# Patient Record
Sex: Female | Born: 1985 | Race: Black or African American | Hispanic: No | Marital: Married | Smoking: Former smoker
Health system: Southern US, Community
[De-identification: ages and names within clinical notes are randomized; demographics above are authoritative.]

## PROBLEM LIST (undated history)

## (undated) DIAGNOSIS — C801 Malignant (primary) neoplasm, unspecified: Secondary | ICD-10-CM

## (undated) DIAGNOSIS — I10 Essential (primary) hypertension: Secondary | ICD-10-CM

## (undated) HISTORY — PX: BREAST SURGERY: SHX581

## (undated) HISTORY — PX: NO PAST SURGERIES: SHX2092

## (undated) HISTORY — PX: OTHER SURGICAL HISTORY: SHX169

---
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
## 2010-09-24 ENCOUNTER — Inpatient Hospital Stay (HOSPITAL_COMMUNITY)
Admission: AD | Admit: 2010-09-24 | Discharge: 2010-09-26 | Payer: Self-pay | Source: Home / Self Care | Admitting: Obstetrics and Gynecology

## 2010-09-25 ENCOUNTER — Ambulatory Visit (HOSPITAL_COMMUNITY)
Admission: RE | Admit: 2010-09-25 | Discharge: 2010-09-25 | Payer: Self-pay | Source: Home / Self Care | Admitting: Obstetrics and Gynecology

## 2010-09-29 ENCOUNTER — Inpatient Hospital Stay (HOSPITAL_COMMUNITY)
Admission: AD | Admit: 2010-09-29 | Discharge: 2010-09-29 | Payer: Self-pay | Source: Home / Self Care | Admitting: Obstetrics and Gynecology

## 2010-10-04 ENCOUNTER — Inpatient Hospital Stay (HOSPITAL_COMMUNITY)
Admission: AD | Admit: 2010-10-04 | Discharge: 2010-10-12 | Payer: Self-pay | Source: Home / Self Care | Attending: Obstetrics and Gynecology | Admitting: Obstetrics and Gynecology

## 2010-10-08 ENCOUNTER — Encounter (INDEPENDENT_AMBULATORY_CARE_PROVIDER_SITE_OTHER): Payer: Self-pay | Admitting: Obstetrics and Gynecology

## 2010-10-08 DIAGNOSIS — O1490 Unspecified pre-eclampsia, unspecified trimester: Secondary | ICD-10-CM

## 2010-10-16 ENCOUNTER — Inpatient Hospital Stay (HOSPITAL_COMMUNITY)
Admission: AD | Admit: 2010-10-16 | Discharge: 2010-10-16 | Disposition: A | Discharge status: Still In Hospital | Payer: Self-pay | Source: Home / Self Care | Attending: Obstetrics and Gynecology | Admitting: Obstetrics and Gynecology

## 2011-01-15 LAB — COMPREHENSIVE METABOLIC PANEL
ALT: 23 U/L (ref 0–35)
ALT: 24 U/L (ref 0–35)
ALT: 35 U/L (ref 0–35)
ALT: 46 U/L — ABNORMAL HIGH (ref 0–35)
ALT: 9 U/L (ref 0–35)
AST: 21 U/L (ref 0–37)
AST: 24 U/L (ref 0–37)
AST: 30 U/L (ref 0–37)
AST: 15 U/L (ref 0–37)
AST: 18 U/L (ref 0–37)
Albumin: 2.1 g/dL — ABNORMAL LOW (ref 3.5–5.2)
Albumin: 2.5 g/dL — ABNORMAL LOW (ref 3.5–5.2)
Albumin: 2.5 g/dL — ABNORMAL LOW (ref 3.5–5.2)
Albumin: 2.3 g/dL — ABNORMAL LOW (ref 3.5–5.2)
Albumin: 2.6 g/dL — ABNORMAL LOW (ref 3.5–5.2)
Albumin: 2.8 g/dL — ABNORMAL LOW (ref 3.5–5.2)
Alkaline Phosphatase: 115 U/L (ref 39–117)
Alkaline Phosphatase: 187 U/L — ABNORMAL HIGH (ref 39–117)
Alkaline Phosphatase: 201 U/L — ABNORMAL HIGH (ref 39–117)
Alkaline Phosphatase: 169 U/L — ABNORMAL HIGH (ref 39–117)
Alkaline Phosphatase: 181 U/L — ABNORMAL HIGH (ref 39–117)
BUN: 10 mg/dL (ref 6–23)
BUN: 12 mg/dL (ref 6–23)
BUN: 12 mg/dL (ref 6–23)
BUN: 8 mg/dL (ref 6–23)
BUN: 8 mg/dL (ref 6–23)
BUN: 8 mg/dL (ref 6–23)
CO2: 21 mEq/L (ref 19–32)
CO2: 21 mEq/L (ref 19–32)
CO2: 24 mEq/L (ref 19–32)
Calcium: 7.2 mg/dL — ABNORMAL LOW (ref 8.4–10.5)
Calcium: 7.3 mg/dL — ABNORMAL LOW (ref 8.4–10.5)
Calcium: 7.6 mg/dL — ABNORMAL LOW (ref 8.4–10.5)
Calcium: 9 mg/dL (ref 8.4–10.5)
Calcium: 8.7 mg/dL (ref 8.4–10.5)
Calcium: 9 mg/dL (ref 8.4–10.5)
Calcium: 9.3 mg/dL (ref 8.4–10.5)
Chloride: 106 mEq/L (ref 96–112)
Chloride: 106 mEq/L (ref 96–112)
Chloride: 107 mEq/L (ref 96–112)
Chloride: 109 mEq/L (ref 96–112)
Chloride: 109 mEq/L (ref 96–112)
Creatinine, Ser: 0.88 mg/dL (ref 0.4–1.2)
Creatinine, Ser: 0.88 mg/dL (ref 0.4–1.2)
Creatinine, Ser: 0.75 mg/dL (ref 0.4–1.2)
Creatinine, Ser: 0.82 mg/dL (ref 0.4–1.2)
GFR calc Af Amer: >60 mL/min (ref >60)
GFR calc Af Amer: >60 mL/min (ref >60)
GFR calc Af Amer: >60 mL/min (ref >60)
GFR calc Af Amer: >60 mL/min (ref >60)
GFR calc Af Amer: >60 mL/min (ref >60)
GFR calc non Af Amer: >60 mL/min (ref >60)
GFR calc non Af Amer: >60 mL/min (ref >60)
GFR calc non Af Amer: >60 mL/min (ref >60)
GFR calc non Af Amer: >60 mL/min (ref >60)
Glucose, Bld: 110 mg/dL — ABNORMAL HIGH (ref 70–99)
Glucose, Bld: 93 mg/dL (ref 70–99)
Glucose, Bld: 96 mg/dL (ref 70–99)
Glucose, Bld: 95 mg/dL (ref 70–99)
Potassium: 4.1 mEq/L (ref 3.5–5.1)
Potassium: 4.7 mEq/L (ref 3.5–5.1)
Potassium: 4.9 mEq/L (ref 3.5–5.1)
Potassium: 4.1 mEq/L (ref 3.5–5.1)
Potassium: 4.2 mEq/L (ref 3.5–5.1)
Sodium: 132 mEq/L — ABNORMAL LOW (ref 135–145)
Sodium: 136 mEq/L (ref 135–145)
Sodium: 139 mEq/L (ref 135–145)
Sodium: 136 mEq/L (ref 135–145)
Total Bilirubin: 0.3 mg/dL (ref 0.3–1.2)
Total Bilirubin: 0.4 mg/dL (ref 0.3–1.2)
Total Bilirubin: 0.6 mg/dL (ref 0.3–1.2)
Total Bilirubin: 1 mg/dL (ref 0.3–1.2)
Total Bilirubin: 0.1 mg/dL — ABNORMAL LOW (ref 0.3–1.2)
Total Bilirubin: 0.3 mg/dL (ref 0.3–1.2)
Total Bilirubin: 0.4 mg/dL (ref 0.3–1.2)
Total Protein: 5.1 g/dL — ABNORMAL LOW (ref 6.0–8.3)
Total Protein: 5.5 g/dL — ABNORMAL LOW (ref 6.0–8.3)
Total Protein: 5.8 g/dL — ABNORMAL LOW (ref 6.0–8.3)
Total Protein: 5 g/dL — ABNORMAL LOW (ref 6.0–8.3)
Total Protein: 5.2 g/dL — ABNORMAL LOW (ref 6.0–8.3)
Total Protein: 6.1 g/dL (ref 6.0–8.3)

## 2011-01-15 LAB — CBC
HCT: 29.6 % — ABNORMAL LOW (ref 36.0–46.0)
HCT: 31 % — ABNORMAL LOW (ref 36.0–46.0)
HCT: 31.4 % — ABNORMAL LOW (ref 36.0–46.0)
HCT: 33.2 % — ABNORMAL LOW (ref 36.0–46.0)
HCT: 34.8 % — ABNORMAL LOW (ref 36.0–46.0)
Hemoglobin: 10.4 g/dL — ABNORMAL LOW (ref 12.0–15.0)
Hemoglobin: 10.8 g/dL — ABNORMAL LOW (ref 12.0–15.0)
Hemoglobin: 10.9 g/dL — ABNORMAL LOW (ref 12.0–15.0)
Hemoglobin: 11.5 g/dL — ABNORMAL LOW (ref 12.0–15.0)
Hemoglobin: 9.1 g/dL — ABNORMAL LOW (ref 12.0–15.0)
MCH: 29.4 pg (ref 26.0–34.0)
MCH: 29.5 pg (ref 26.0–34.0)
MCH: 29.5 pg (ref 26.0–34.0)
MCH: 28.6 pg (ref 26.0–34.0)
MCH: 28.8 pg (ref 26.0–34.0)
MCHC: 32.7 g/dL (ref 30.0–36.0)
MCHC: 33 g/dL (ref 30.0–36.0)
MCHC: 33.1 g/dL (ref 30.0–36.0)
MCHC: 33.1 g/dL (ref 30.0–36.0)
MCHC: 33.2 g/dL (ref 30.0–36.0)
MCHC: 32.6 g/dL (ref 30.0–36.0)
MCHC: 32.8 g/dL (ref 30.0–36.0)
MCV: 88.7 fL (ref 78.0–100.0)
MCV: 89.3 fL (ref 78.0–100.0)
MCV: 89.3 fL (ref 78.0–100.0)
MCV: 89.8 fL (ref 78.0–100.0)
MCV: 87.3 fL (ref 78.0–100.0)
Platelets: 115 10*3/uL — ABNORMAL LOW (ref 150–400)
Platelets: 154 10*3/uL (ref 150–400)
Platelets: 88 10*3/uL — ABNORMAL LOW (ref 150–400)
Platelets: 96 10*3/uL — ABNORMAL LOW (ref 150–400)
Platelets: 150 10*3/uL (ref 150–400)
Platelets: 159 10*3/uL (ref 150–400)
Platelets: 163 10*3/uL (ref 150–400)
Platelets: 169 10*3/uL (ref 150–400)
RBC: 3.19 MIL/uL — ABNORMAL LOW (ref 3.87–5.11)
RBC: 3.53 MIL/uL — ABNORMAL LOW (ref 3.87–5.11)
RBC: 3.54 MIL/uL — ABNORMAL LOW (ref 3.87–5.11)
RBC: 3.93 MIL/uL (ref 3.87–5.11)
RBC: 3.56 MIL/uL — ABNORMAL LOW (ref 3.87–5.11)
RBC: 3.92 MIL/uL (ref 3.87–5.11)
RDW: 17.6 % — ABNORMAL HIGH (ref 11.5–15.5)
RDW: 17.6 % — ABNORMAL HIGH (ref 11.5–15.5)
RDW: 17.9 % — ABNORMAL HIGH (ref 11.5–15.5)
RDW: 18 % — ABNORMAL HIGH (ref 11.5–15.5)
RDW: 18.2 % — ABNORMAL HIGH (ref 11.5–15.5)
RDW: 18.2 % — ABNORMAL HIGH (ref 11.5–15.5)
RDW: 15.8 % — ABNORMAL HIGH (ref 11.5–15.5)
RDW: 15.9 % — ABNORMAL HIGH (ref 11.5–15.5)
RDW: 16.2 % — ABNORMAL HIGH (ref 11.5–15.5)
RDW: 16.5 % — ABNORMAL HIGH (ref 11.5–15.5)
WBC: 10.1 10*3/uL (ref 4.0–10.5)
WBC: 13.5 10*3/uL — ABNORMAL HIGH (ref 4.0–10.5)
WBC: 13.6 10*3/uL — ABNORMAL HIGH (ref 4.0–10.5)
WBC: 9.7 10*3/uL (ref 4.0–10.5)

## 2011-01-15 LAB — URINALYSIS, ROUTINE W REFLEX MICROSCOPIC
Bilirubin Urine: NEGATIVE
Bilirubin Urine: NEGATIVE
Bilirubin Urine: NEGATIVE
Glucose, UA: NEGATIVE mg/dL
Hgb urine dipstick: NEGATIVE
Ketones, ur: NEGATIVE mg/dL
Leukocytes, UA: NEGATIVE
Nitrite: NEGATIVE
Nitrite: NEGATIVE
Nitrite: NEGATIVE
Protein, ur: 30 mg/dL — AB
Specific Gravity, Urine: >1.03 — ABNORMAL HIGH (ref 1.005–1.030)
Specific Gravity, Urine: 1.02 (ref 1.005–1.030)
Specific Gravity, Urine: >1.03 — ABNORMAL HIGH (ref 1.005–1.030)
pH: 6 (ref 5.0–8.0)
pH: 6.5 (ref 5.0–8.0)
pH: 7 (ref 5.0–8.0)

## 2011-01-15 LAB — HEPATIC FUNCTION PANEL
ALT: 39 U/L — ABNORMAL HIGH (ref 0–35)
AST: 36 U/L (ref 0–37)
Bilirubin, Direct: 0.2 mg/dL (ref 0.0–0.3)
Indirect Bilirubin: 0.5 mg/dL (ref 0.3–0.9)
Total Protein: 5 g/dL — ABNORMAL LOW (ref 6.0–8.3)

## 2011-01-15 LAB — LACTATE DEHYDROGENASE
LDH: 214 U/L (ref 94–250)
LDH: 230 U/L (ref 94–250)
LDH: 292 U/L — ABNORMAL HIGH (ref 94–250)
LDH: 123 U/L (ref 94–250)

## 2011-01-15 LAB — CREATININE CLEARANCE, URINE, 24 HOUR
Creatinine Clearance: 208 mL/min — ABNORMAL HIGH (ref 75–115)
Creatinine, 24H Ur: 1973 mg/d — ABNORMAL HIGH (ref 700–1800)
Creatinine, Urine: 175.4 mg/dL
Creatinine: 0.66 mg/dL (ref 0.40–1.20)

## 2011-01-15 LAB — URIC ACID
Uric Acid, Serum: 6.6 mg/dL (ref 2.4–7.0)
Uric Acid, Serum: 6.6 mg/dL (ref 2.4–7.0)
Uric Acid, Serum: 6.8 mg/dL (ref 2.4–7.0)
Uric Acid, Serum: 6.8 mg/dL (ref 2.4–7.0)
Uric Acid, Serum: 5.4 mg/dL (ref 2.4–7.0)
Uric Acid, Serum: 5.5 mg/dL (ref 2.4–7.0)
Uric Acid, Serum: 5.5 mg/dL (ref 2.4–7.0)
Uric Acid, Serum: 5.6 mg/dL (ref 2.4–7.0)
Uric Acid, Serum: 5.6 mg/dL (ref 2.4–7.0)

## 2011-01-15 LAB — DIFFERENTIAL
Basophils Absolute: 0 10*3/uL (ref 0.0–0.1)
Basophils Relative: 0 % (ref 0–1)
Eosinophils Absolute: 0.1 10*3/uL (ref 0.0–0.7)
Eosinophils Absolute: 0.1 10*3/uL (ref 0.0–0.7)
Eosinophils Relative: 1 % (ref 0–5)
Eosinophils Relative: 1 % (ref 0–5)
Lymphocytes Relative: 23 % (ref 12–46)
Lymphs Abs: 1.9 10*3/uL (ref 0.7–4.0)
Monocytes Absolute: 0.6 10*3/uL (ref 0.1–1.0)
Monocytes Relative: 8 % (ref 3–12)
Neutro Abs: 6.9 10*3/uL (ref 1.7–7.7)

## 2011-01-15 LAB — MRSA PCR SCREENING: MRSA by PCR: NEGATIVE

## 2011-01-15 LAB — URINE CULTURE: Colony Count: >=100000

## 2011-01-15 LAB — URINE MICROSCOPIC-ADD ON

## 2011-01-15 LAB — PROTEIN, URINE, 24 HOUR: Protein, Urine: 32 mg/dL

## 2011-01-15 LAB — BASIC METABOLIC PANEL
BUN: 12 mg/dL (ref 6–23)
Calcium: 6.9 mg/dL — ABNORMAL LOW (ref 8.4–10.5)
Creatinine, Ser: 0.9 mg/dL (ref 0.4–1.2)
GFR calc Af Amer: >60 mL/min (ref >60)

## 2011-01-15 LAB — STREP B DNA PROBE

## 2011-09-01 ENCOUNTER — Emergency Department (HOSPITAL_COMMUNITY)
Admission: EM | Admit: 2011-09-01 | Discharge: 2011-09-01 | Disposition: A | Discharge status: Home / Self Care | Payer: BC Managed Care – PPO | Attending: Emergency Medicine | Admitting: Emergency Medicine

## 2011-09-01 DIAGNOSIS — I10 Essential (primary) hypertension: Secondary | ICD-10-CM | POA: Insufficient documentation

## 2011-09-01 DIAGNOSIS — K089 Disorder of teeth and supporting structures, unspecified: Secondary | ICD-10-CM | POA: Insufficient documentation

## 2011-09-01 DIAGNOSIS — K029 Dental caries, unspecified: Secondary | ICD-10-CM | POA: Insufficient documentation

## 2011-11-09 IMAGING — US US OB COMP +14 WK
1 series · 15 of 28 positions shown · non-contrast
Comparison: none

[Series 1: us ob comp +14 wk · 15 of 35 slices shown]
[im 1/35]
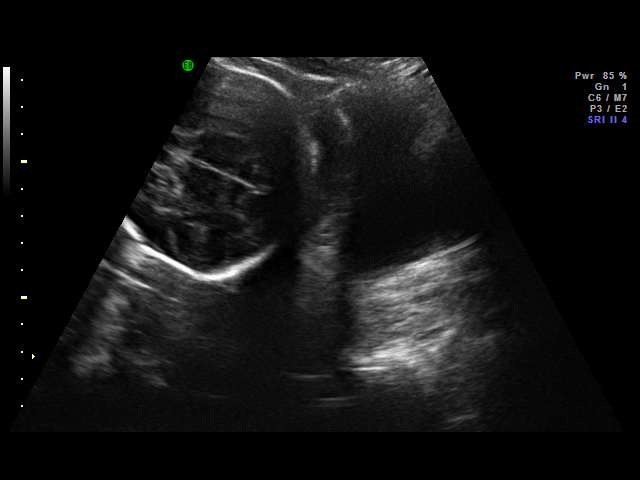
[im 3/35]
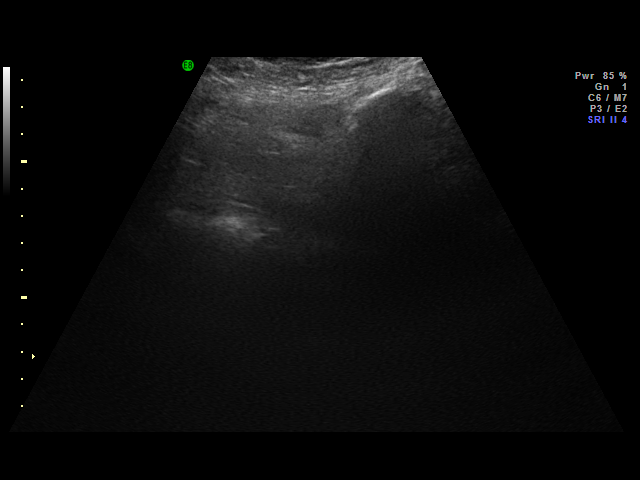
[im 6/35]
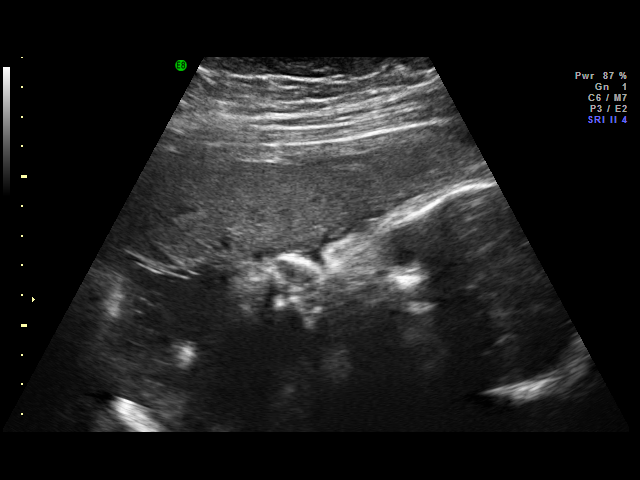
[im 8/35]
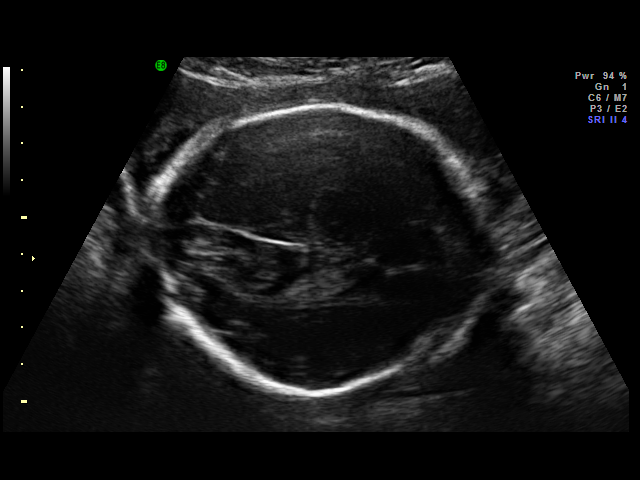
[im 11/35]
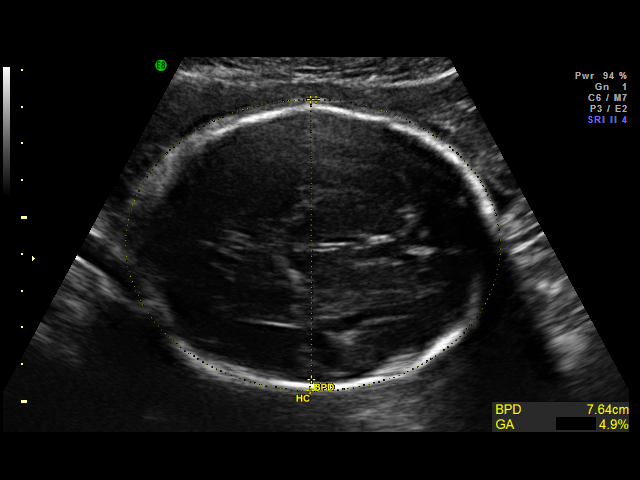
[im 13/35]
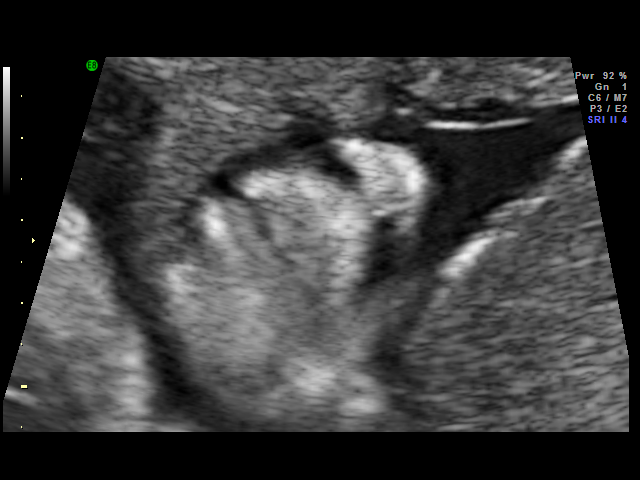
[im 16/35]
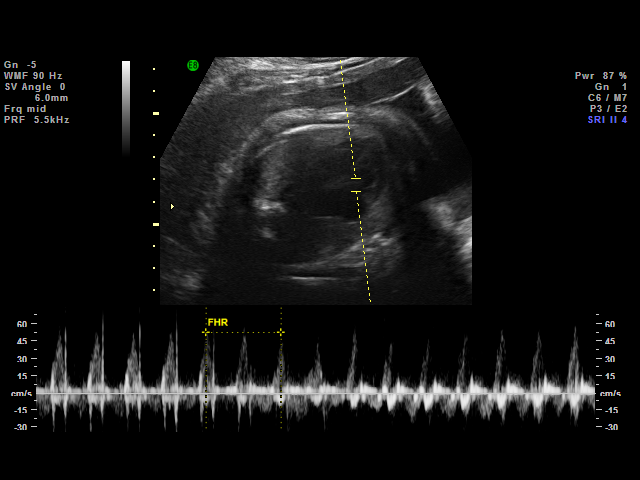
[im 18/35]
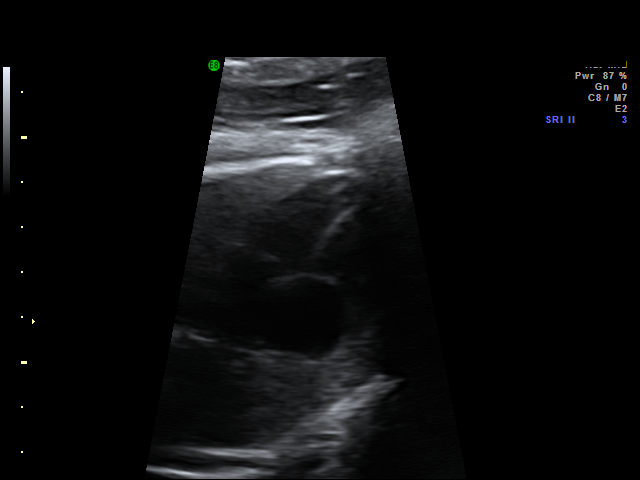
[im 19/35]
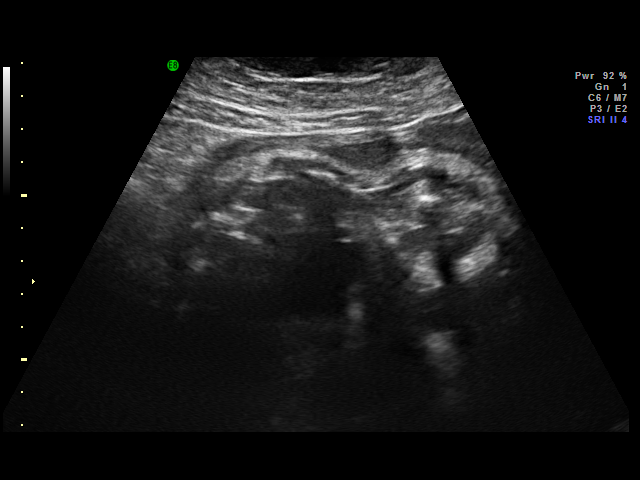
[im 22/35]
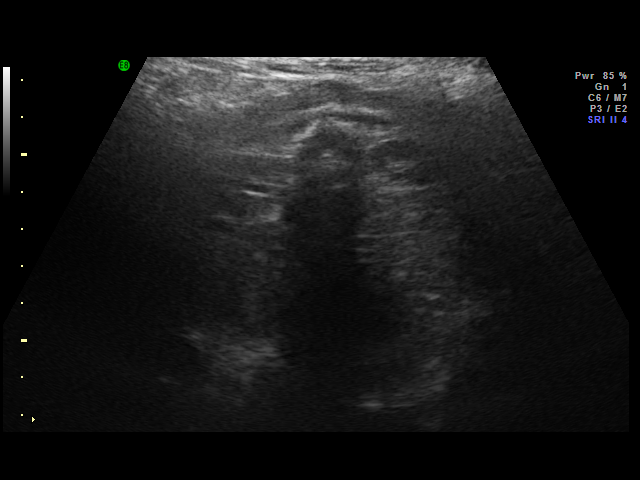
[im 24/35]
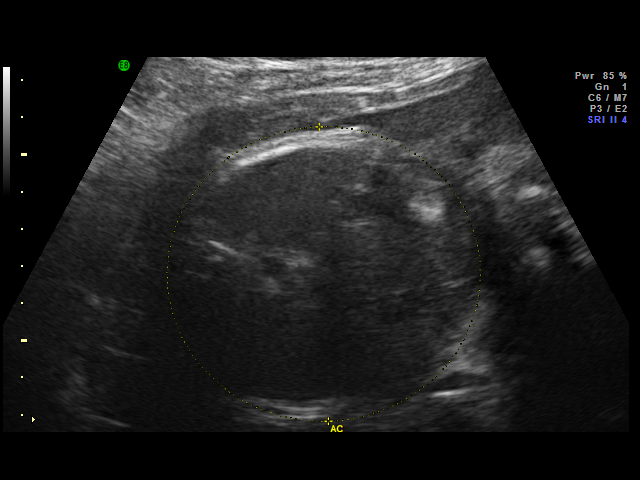
[im 27/35]
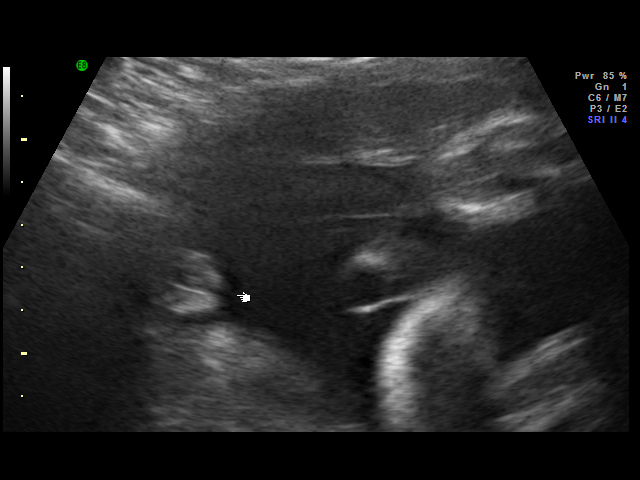
[im 29/35]
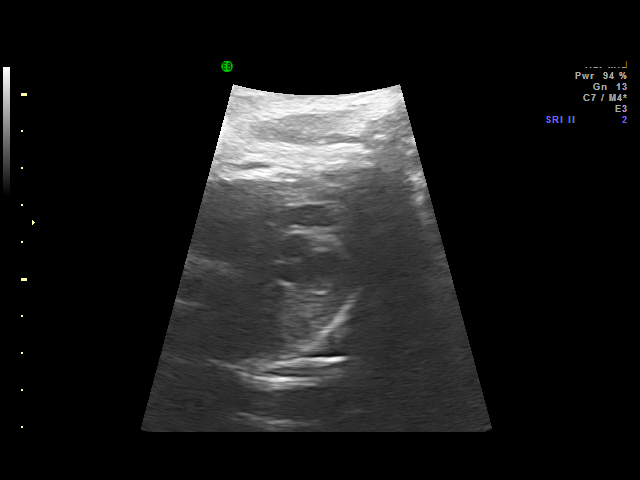
[im 32/35]
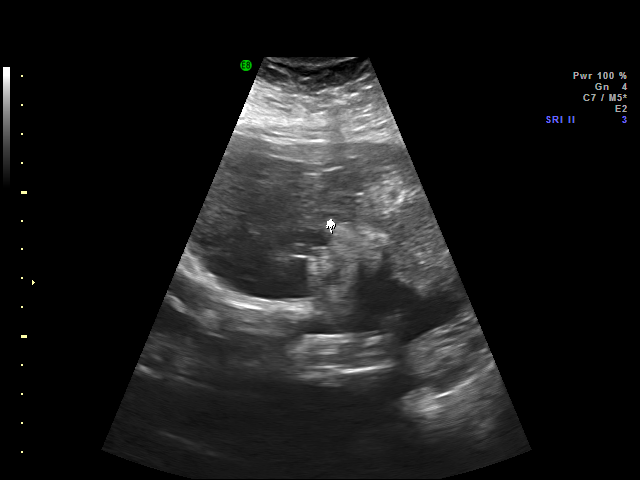
[im 35/35]
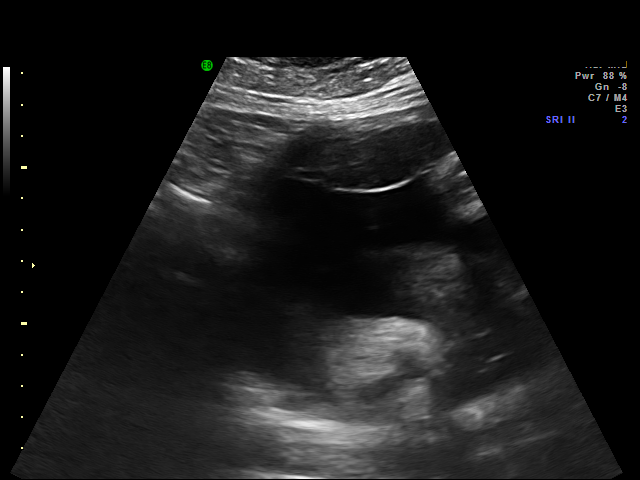

[15 of 28 positions shown; findings below may reference images not displayed]

OBSTETRICS REPORT
                      (Signed Final 09/25/2010 [DATE])

 Name:     CHECHE DIB                 Visit Date: 09/25/2010 [DATE]

 Order#:         _O
Procedures

 US OB COMP +14 WK                                     76805.1
Indications

 Hypertension - gestational
Fetal Evaluation

 Fetal Heart Rate:  157                          bpm
 Cardiac Activity:  Observed
 Presentation:      Cephalic
 Placenta:          Anterior, above cervical os
 P. Cord            Not well visualized
 Insertion:

 Amniotic Fluid
 AFI FV:      Subjectively within normal limits
 AFI Sum:     11.19   cm       26  %Tile     Larg Pckt:    4.09  cm
 RUQ:   4.09    cm   RLQ:    2.19   cm    LUQ:   2.3     cm   LLQ:    2.61   cm
Biometry

 BPD:     76.4  mm     G. Age:  30w 5d                CI:         75.0   70 - 86
 OFD:    101.8  mm                                    FL/HC:      21.4   19.1 -

 HC:     285.7  mm     G. Age:  31w 3d        3  %    HC/AC:      1.02   0.96 -

 AC:     281.4  mm     G. Age:  32w 1d       42  %    FL/BPD:     80.1   71 - 87
 FL:      61.2  mm     G. Age:  31w 5d       22  %    FL/AC:      21.7   20 - 24
 HUM:     56.2  mm     G. Age:  32w 5d       59  %
 CER:     41.3  mm     G. Age:  35w 2d       84  %
 Est. FW:    4381  gm      4 lb 1 oz     46  %
Gestational Age

 Clinical EDD:  32w 3d                                        EDD:   11/17/10
 U/S Today:     31w 3d                                        EDD:   11/24/10
 Best:          32w 3d     Det. By:  Clinical EDD             EDD:   11/17/10
Anatomy
 Cranium:           Appears normal      Aortic Arch:       Not well
                                                           visualized
 Fetal Cavum:       Appears normal      Ductal Arch:       Not well
                                                           visualized
 Ventricles:        Appears normal      Diaphragm:         Appears normal
 Choroid Plexus:    Appears normal      Stomach:           Appears
                                                           normal, left
                                                           sided
 Cerebellum:        Appears normal      Abdomen:           Appears normal
 Posterior Fossa:   Appears normal      Abdominal Wall:    Not well
                                                           visualized
 Nuchal Fold:       Not applicable      Cord Vessels:      Appears normal
                    (>20 wks GA)                           (3 vessel cord)
 Face:              Appears normal      Kidneys:           Appear normal
                    (lips/profile/orbit
                    s)
 Heart:             Appears normal      Bladder:           Appears normal
                    (4 chamber &
                    axis)
 RVOT:              Appears normal      Spine:             Not well
                                                           visualized
 LVOT:              Not well            Limbs:             Not well
                    visualized                             visualized

 Other:     Fetus appears to be a female. Technically difficult due to
            advanced GA and fetal position.
Targeted Anatomy

 Fetal Central Nervous System
 Cisterna Magna:
Cervix Uterus Adnexa

 Cervical Length:    4.2      cm

 Cervix:       Normal appearance by transabdominal scan.
 Uterus:       No abnormality visualized.
 Left Ovary:    Not visualized.
 Right Ovary:   Not visualized.
 Adnexa:     No adnexal mass visualized.
Impression

 IUP at 32+3 weeks
 Normal level II fetal anatomy; not optimally visualized: LVOT,
 arches, CI, spine and extremities
 Normal amniotic fluid volume
 Measurements consistant with stated EDC with EFW at the
 46th %tile
Recommendations

 Follow-up as clinically indicated.

 questions or concerns.

## 2013-02-09 ENCOUNTER — Ambulatory Visit (INDEPENDENT_AMBULATORY_CARE_PROVIDER_SITE_OTHER): Payer: BC Managed Care – PPO | Admitting: Family Medicine

## 2013-02-09 VITALS — BP 138/90 | HR 100 | Temp 99.9°F | Resp 18 | Ht 72.0 in | Wt 250.2 lb

## 2013-02-09 DIAGNOSIS — K529 Noninfective gastroenteritis and colitis, unspecified: Secondary | ICD-10-CM

## 2013-02-09 DIAGNOSIS — K5289 Other specified noninfective gastroenteritis and colitis: Secondary | ICD-10-CM

## 2013-02-09 DIAGNOSIS — E860 Dehydration: Secondary | ICD-10-CM

## 2013-02-09 DIAGNOSIS — A5901 Trichomonal vulvovaginitis: Secondary | ICD-10-CM

## 2013-02-09 LAB — POCT CBC
Lymph, poc: 1.6 (ref 0.6–3.4)
MCH, POC: 28.1 pg (ref 27–31.2)
MCHC: 31.5 g/dL — AB (ref 31.8–35.4)
MCV: 89.2 fL (ref 80–97)
MID (cbc): 0.4 (ref 0–0.9)
MPV: 11.1 fL (ref 0–99.8)
POC LYMPH PERCENT: 41.9 %L (ref 10–50)
POC MID %: 10.2 %M (ref 0–12)
Platelet Count, POC: 160 10*3/uL (ref 142–424)
RDW, POC: 15.5 %
WBC: 3.9 10*3/uL — AB (ref 4.6–10.2)

## 2013-02-09 LAB — POCT URINALYSIS DIPSTICK
Blood, UA: NEGATIVE
Ketones, UA: NEGATIVE
Protein, UA: NEGATIVE
Spec Grav, UA: 1.015
Urobilinogen, UA: 1

## 2013-02-09 LAB — POCT UA - MICROSCOPIC ONLY: Trichomonas, UA: POSITIVE

## 2013-02-09 MED ORDER — METRONIDAZOLE 500 MG PO TABS: ORAL_TABLET | ORAL | Status: DC

## 2013-02-09 MED ORDER — PROMETHAZINE HCL 25 MG PO TABS: ORAL_TABLET | ORAL | Status: DC

## 2013-02-09 NOTE — Progress Notes (Signed)
[address]   [place], [place]  [address]   [phone]  Subjective:    Patient ID: Karina Rivera, female    DOB: [birth year], 27 y.o.   MRN: [id number]  HPI This 27 y.o. female presents for evaluation of n/v/d.  Onset yesterday.  +fever Tmax 102.  +chills/sweats.  +HA  +vomiting x 1 today; yesterday vomited x 4; food contents and watery; non-bloody. Diarrhea x 6-7; black stools; dark green; watery.  No bloody stools.  +abdominal pain less today but more yesterday.  Mild dizzy.  Has drunk ginger ale 8 ounces and orange juice 8 ounces.  Urinated x several today.  No cold symptoms.  Son sick over the weekend; also works at [employer].  Tried to eat some soup today but did not sit well with stomach; ate [employer].  Took Pepto bismol and Dayquil yesterday; stopped medications today. LMP 01-02-13; irregular menses; Implanon.  Last sexual activity one month ago.  No vaginal discharge, irritation.  PCP:  None; [facility] mostly.   Review of Systems  Constitutional: Positive for fever, chills, diaphoresis and fatigue.  HENT: Negative for congestion, sore throat, rhinorrhea and postnasal drip.   Respiratory: Negative for shortness of breath.   Gastrointestinal: Positive for nausea, vomiting, abdominal pain and diarrhea. Negative for constipation, blood in stool, abdominal distention and rectal pain.  Skin: Negative for rash.  Neurological: Positive for dizziness and light-headedness. Negative for headaches.        History reviewed. No pertinent past medical history.  History reviewed. No pertinent past surgical history.  Prior to Admission medications   Not on File    No Known Allergies  History   Social History  . Marital Status: Married    Spouse Name: N/A    Number of Children: N/A  . Years of Education: N/A   Occupational History  . Not on file.   Social History Main Topics  . Smoking status: Current Every Day Smoker  . Smokeless tobacco: Not on file  . Alcohol Use: Yes  . Drug  Use: No  . Sexually Active: Yes    Birth Control/ Protection: Implant   Other Topics Concern  . Not on file   Social History Narrative  . No narrative on file    Family History  Problem Relation Age of Onset  . Diabetes Father   . Asthma Daughter   . Heart disease Maternal Grandmother     Objective:   Physical Exam  Nursing note and vitals reviewed. Constitutional: She is oriented to person, place, and time. She appears well-developed and well-nourished. No distress.  HENT:  Mouth/Throat: Oropharynx is clear and moist.  Eyes: Conjunctivae are normal. Pupils are equal, round, and reactive to light.  Neck: Normal range of motion. Neck supple.  Cardiovascular: Normal rate, regular rhythm and normal heart sounds.   Pulmonary/Chest: Effort normal and breath sounds normal. No respiratory distress. She has no wheezes. She has no rales.  Abdominal: Soft. Bowel sounds are normal. She exhibits no distension and no mass. There is tenderness in the epigastric area. There is no rebound and no guarding.  Neurological: She is alert and oriented to person, place, and time.  Skin: Skin is warm and dry. No rash noted. She is not diaphoretic.  Psychiatric: She has a normal mood and affect. Her behavior is normal.   Results for orders placed in visit on 02/09/13  POCT CBC      Result Value Range   WBC 3.9 (*) 4.6 -  10.2 K/uL   Lymph, poc 1.6  0.6 - 3.4   POC LYMPH PERCENT 41.9  10 - 50 %L   MID (cbc) 0.4  0 - 0.9   POC MID % 10.2  0 - 12 %M   POC Granulocyte 1.9 (*) 2 - 6.9   Granulocyte percent 47.9  37 - 80 %G   RBC 4.52  4.04 - 5.48 M/uL   Hemoglobin 12.7  12.2 - 16.2 g/dL   HCT, POC 21.3  08.6 - 47.9 %   MCV 89.2  80 - 97 fL   MCH, POC 28.1  27 - 31.2 pg   MCHC 31.5 (*) 31.8 - 35.4 g/dL   RDW, POC 57.8     Platelet Count, POC 160  142 - 424 K/uL   MPV 11.1  0 - 99.8 fL  GLUCOSE, POCT (MANUAL RESULT ENTRY)      Result Value Range   POC Glucose 95  70 - 99 mg/dl  POCT URINALYSIS  DIPSTICK      Result Value Range   Color, UA yellow     Clarity, UA clear     Glucose, UA neg     Bilirubin, UA neg     Ketones, UA neg     Spec Grav, UA 1.015     Blood, UA neg     pH, UA 7.0     Protein, UA neg     Urobilinogen, UA 1.0     Nitrite, UA neg     Leukocytes, UA small (1+)    POCT UA - MICROSCOPIC ONLY      Result Value Range   WBC, Ur, HPF, POC 2-5     RBC, urine, microscopic 2-8     Bacteria, U Microscopic trace     Mucus, UA neg     Epithelial cells, urine per micros 3-7 with large clusters     Crystals, Ur, HPF, POC neg     Casts, Ur, LPF, POC neg     Yeast, UA neg     Trichomonas, UA positive         Assessment & Plan:  Gastroenteritis - Plan: POCT CBC, POCT glucose (manual entry), POCT urinalysis dipstick, Comprehensive metabolic panel  Dehydration  Trichomonas vaginitis  1. Gastroenteritis:  New.  Rx for Phenergan provided; recommend BRAT diet, hydration.  Recommend sparing use of Imodium bid only.   RTC if develops worsening dizziness, vomiting, diarrhea.   2.  Dehydration Mild to Moderate:  New. Encourage fluid intake; pt declined ivf tonight.  Will RTC inability to keep down fluids. 3. Trichomonas vaginitis:  New. Detected on u/a; asymptomatic; rx for Metronidazole provided; advised to RTC or follow-up with gynecology for full STD screening in upcoming month.  Meds ordered this encounter  Medications  . promethazine (PHENERGAN) 25 MG tablet    Sig: 1/2 to 1 tablet every 6 hours as needed for nausea    Dispense:  20 tablet    Refill:  0  . metroNIDAZOLE (FLAGYL) 500 MG tablet    Sig: 4 tablets po x 1; do not consume alcohol for two days after taking medication.    Dispense:  4 tablet    Refill:  0

## 2013-02-09 NOTE — Patient Instructions (Addendum)
Gastroenteritis - Plan: POCT CBC, POCT glucose (manual entry), POCT urinalysis dipstick, Comprehensive metabolic panel, POCT UA - Microscopic Only, promethazine (PHENERGAN) 25 MG tablet  Dehydration  Trichomonas vaginitis - Plan: metroNIDAZOLE (FLAGYL) 500 MG tablet   1. RECOMMEND BLAND DIET (TOAST, CRACKERS, RICE, APPLESAUCE).  DRINK LOTS OF FLUIDS. 2.  RECOMMEND IMODIUM ONE PILL TWICE DAILY FOR DIARRHEA 3. RECOMMEND STD SCREENING IN UPCOMING MONTH.

## 2013-02-10 LAB — COMPREHENSIVE METABOLIC PANEL
Albumin: 3.9 g/dL (ref 3.5–5.2)
Alkaline Phosphatase: 78 U/L (ref 39–117)
BUN: 10 mg/dL (ref 6–23)
CO2: 26 mEq/L (ref 19–32)
Glucose, Bld: 88 mg/dL (ref 70–99)
Potassium: 4 mEq/L (ref 3.5–5.3)
Sodium: 138 mEq/L (ref 135–145)
Total Bilirubin: 0.8 mg/dL (ref 0.3–1.2)
Total Protein: 6.7 g/dL (ref 6.0–8.3)

## 2013-03-01 ENCOUNTER — Ambulatory Visit (INDEPENDENT_AMBULATORY_CARE_PROVIDER_SITE_OTHER): Payer: BC Managed Care – PPO | Admitting: Emergency Medicine

## 2013-03-01 VITALS — BP 141/92 | HR 82 | Temp 98.6°F | Resp 18 | Ht 70.0 in | Wt 249.0 lb

## 2013-03-01 DIAGNOSIS — J029 Acute pharyngitis, unspecified: Secondary | ICD-10-CM

## 2013-03-01 DIAGNOSIS — J018 Other acute sinusitis: Secondary | ICD-10-CM

## 2013-03-01 MED ORDER — AMOXICILLIN-POT CLAVULANATE 875-125 MG PO TABS: 1.0000 | ORAL_TABLET | Freq: Two times a day (BID) | ORAL | Status: DC

## 2013-03-01 MED ORDER — PSEUDOEPHEDRINE-GUAIFENESIN ER 60-600 MG PO TB12: 1.0000 | ORAL_TABLET | Freq: Two times a day (BID) | ORAL | Status: AC

## 2013-03-01 NOTE — Patient Instructions (Signed)

## 2013-03-01 NOTE — Progress Notes (Signed)
[facility] [address] [place] [address] [phone]  Date:  03/01/2013   Name:  Karina Rivera   DOB:  10/04/1986   MRN:  [id number]  PCP:  Oliver Pila, MD    Chief Complaint: Sore Throat   History of Present Illness:  Karina Rivera is a 27 y.o. very pleasant female patient who presents with the following:  Ill  Since Thursday with malaise, fever and chills.  Has sore throat, nasal congestion and purulent post nasal drainage.  Has a cough with no wheezing or shortness of breath.  Has cough productive of purulent sputum.  No nausea or vomiting.  No stool change.  No improvement with over the counter medications or other home remedies. Denies other complaint or health concern today.   There is no problem list on file for this patient.   History reviewed. No pertinent past medical history.  History reviewed. No pertinent past surgical history.  History  Substance Use Topics  . Smoking status: Current Every Day Smoker  . Smokeless tobacco: Not on file  . Alcohol Use: Yes    Family History  Problem Relation Age of Onset  . Diabetes Father   . Asthma Daughter   . Heart disease Maternal Grandmother     No Known Allergies  Medication list has been reviewed and updated.  Current Outpatient Prescriptions on File Prior to Visit  Medication Sig Dispense Refill  . metroNIDAZOLE (FLAGYL) 500 MG tablet 4 tablets po x 1; do not consume alcohol for two days after taking medication.  4 tablet  0  . promethazine (PHENERGAN) 25 MG tablet 1/2 to 1 tablet every 6 hours as needed for nausea  20 tablet  0   No current facility-administered medications on file prior to visit.    Review of Systems:  As per HPI, otherwise negative.    Physical Examination: Filed Vitals:   03/01/13 1035  BP: 141/92  Pulse: 82  Temp:   Resp:    Filed Vitals:   03/01/13 1033  Height: 5\' 10"  (1.778 m)  Weight: 249 lb (112.946 kg)   Body mass index is 35.73  kg/(m^2). Ideal Body Weight: Weight in (lb) to have BMI = 25: 173.9  GEN: WDWN, NAD, Non-toxic, A & O x 3 HEENT: Atraumatic, Normocephalic. Neck supple. No masses, No LAD. Ears and Nose: No external deformity.  Congested and swollen with purulent drainage CV: RRR, No M/G/R. No JVD. No thrill. No extra heart sounds. PULM: CTA B, no wheezes, crackles, rhonchi. No retractions. No resp. distress. No accessory muscle use. ABD: S, NT, ND, +BS. No rebound. No HSM. EXTR: No c/c/e NEURO Normal gait.  PSYCH: Normally interactive. Conversant. Not depressed or anxious appearing.  Calm demeanor.    Assessment and Plan: Sinusitis Pharyngitis augmentin mucinex d   Signed,  Phillips Odor, MD

## 2013-05-03 ENCOUNTER — Telehealth: Payer: Self-pay | Admitting: Neurology

## 2013-05-03 NOTE — Telephone Encounter (Signed)
I got a call through the answering service. The patient indicated that she is a patient of Dr. Danae Orleans, and is on seizure medications, and has run out of her medication. The patient has had 3 seizures today. I see no evidence that we have ever seen her through this office, and I do not see that she was ever prescribed any seizure medications, and I am unable to contact her by telephone. Also, the date of birth given by the answering service does not correlate with the date of birth in [place], but the telephone number does.

## 2014-09-23 ENCOUNTER — Emergency Department (HOSPITAL_COMMUNITY)
Admission: EM | Admit: 2014-09-23 | Discharge: 2014-09-23 | Disposition: A | Discharge status: Home / Self Care | Payer: 59 | Attending: Emergency Medicine | Admitting: Emergency Medicine

## 2014-09-23 ENCOUNTER — Ambulatory Visit (INDEPENDENT_AMBULATORY_CARE_PROVIDER_SITE_OTHER): Payer: 59 | Admitting: Family Medicine

## 2014-09-23 ENCOUNTER — Emergency Department (HOSPITAL_COMMUNITY): Payer: 59

## 2014-09-23 VITALS — BP 160/100 | HR 77 | Resp 20

## 2014-09-23 DIAGNOSIS — R0602 Shortness of breath: Secondary | ICD-10-CM

## 2014-09-23 DIAGNOSIS — Z720 Tobacco use: Secondary | ICD-10-CM | POA: Diagnosis not present

## 2014-09-23 DIAGNOSIS — Z3202 Encounter for pregnancy test, result negative: Secondary | ICD-10-CM | POA: Insufficient documentation

## 2014-09-23 DIAGNOSIS — R079 Chest pain, unspecified: Secondary | ICD-10-CM

## 2014-09-23 LAB — BASIC METABOLIC PANEL
Anion gap: 10 (ref 5–15)
BUN: 8 mg/dL (ref 6–23)
CHLORIDE: 102 meq/L (ref 96–112)
CO2: 27 mEq/L (ref 19–32)
CREATININE: 0.8 mg/dL (ref 0.50–1.10)
Calcium: 9 mg/dL (ref 8.4–10.5)
GFR calc Af Amer: >90 mL/min (ref >90)
GFR calc non Af Amer: >90 mL/min (ref >90)
GLUCOSE: 76 mg/dL (ref 70–99)
POTASSIUM: 3.8 meq/L (ref 3.7–5.3)
Sodium: 139 mEq/L (ref 137–147)

## 2014-09-23 LAB — I-STAT TROPONIN, ED: Troponin i, poc: 0 ng/mL (ref 0.00–0.08)

## 2014-09-23 LAB — CBC
HCT: 36.4 % (ref 36.0–46.0)
HEMOGLOBIN: 12 g/dL (ref 12.0–15.0)
MCH: 28.2 pg (ref 26.0–34.0)
MCHC: 33 g/dL (ref 30.0–36.0)
MCV: 85.6 fL (ref 78.0–100.0)
Platelets: 181 10*3/uL (ref 150–400)
RBC: 4.25 MIL/uL (ref 3.87–5.11)
RDW: 13.8 % (ref 11.5–15.5)
WBC: 5.6 10*3/uL (ref 4.0–10.5)

## 2014-09-23 LAB — HEPATIC FUNCTION PANEL
ALBUMIN: 3.5 g/dL (ref 3.5–5.2)
ALT: 22 U/L (ref 0–35)
AST: 19 U/L (ref 0–37)
Alkaline Phosphatase: 95 U/L (ref 39–117)
Bilirubin, Direct: <0.2 mg/dL (ref 0.0–0.3)
TOTAL PROTEIN: 7.1 g/dL (ref 6.0–8.3)
Total Bilirubin: 0.4 mg/dL (ref 0.3–1.2)

## 2014-09-23 LAB — LIPASE, BLOOD: LIPASE: 17 U/L (ref 11–59)

## 2014-09-23 LAB — POC URINE PREG, ED: Preg Test, Ur: NEGATIVE

## 2014-09-23 LAB — D-DIMER, QUANTITATIVE (NOT AT ARMC): D DIMER QUANT: 0.27 ug{FEU}/mL (ref 0.00–0.48)

## 2014-09-23 MED ORDER — GI COCKTAIL ~~LOC~~: 30.0000 mL | Freq: Once | ORAL | Status: DC

## 2014-09-23 MED ORDER — LORAZEPAM 1 MG PO TABS: 1.0000 mg | ORAL_TABLET | Freq: Four times a day (QID) | ORAL | Status: DC | PRN

## 2014-09-23 MED ORDER — OMEPRAZOLE 20 MG PO CPDR: 20.0000 mg | DELAYED_RELEASE_CAPSULE | Freq: Every day | ORAL | Status: DC

## 2014-09-23 MED ORDER — ASPIRIN 81 MG PO CHEW: 324.0000 mg | CHEWABLE_TABLET | Freq: Once | ORAL | Status: DC

## 2014-09-23 MED ORDER — MORPHINE SULFATE 4 MG/ML IJ SOLN
4.0000 mg | Freq: Once | INTRAMUSCULAR | Status: AC
Start: 2014-09-23 — End: 2014-09-23
  Administered 2014-09-23: 4 mg via INTRAVENOUS
  Filled 2014-09-23: qty 1
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
## 2014-09-23 NOTE — ED Notes (Addendum)
Substernal Cp since Tuesday. Reproducible with palpation. ALSO, left arm, nausea, sob, and epigastric pain. Burping makes her feel better. Pt. Got maalox and 324 mg asa. Aspirin helped the pain.

## 2014-09-23 NOTE — ED Notes (Signed)
Patient transported to X-ray 

## 2014-09-23 NOTE — Progress Notes (Signed)
[facility] [address], [place] [id number] [phone]  Date:  09/23/2014   Name:  Karina DatesChenay A Donlan   DOB:  07/21/1986   MRN:  [id number]  PCP:  Oliver PilaICHARDSON,KATHY W, MD    Chief Complaint: No chief complaint on file.   History of Present Illness:  Karina Rivera is a 28 y.o. very pleasant female patient who presents with the following:  Today is Friday. Since Wednesday she has noted sx of chest pain and SOB.  She notes some pain into her arms, and the sx seem to be worse at night.  Laying down makes her feel a little bit worse.  She has not noted worsening CP with exertion but has noted that she needs to stop and rest when climbing stairs; this is not her baseline.    She is generally in good health except for overweight, no history of cardiac issues or PE/DVT.  She is a smoker.    Family history of CAD/ CHF but no history of DVT or PE as far as they know  Given GI cocktail which did take her pain down somewhat, but she continued to note a feeling of "pressure and heaviness in my chest," and she continued to feel SOB.  Decided to send her to the ER for further evaluation.  She and her mother are in agreement with this plan.    She is a smoker, history of pre-eclampsia with pregnancy.  Works in medical billing at [employer]  There are no active problems to display for this patient.   No past medical history on file.  No past surgical history on file.  History  Substance Use Topics  . Smoking status: Current Every Day Smoker  . Smokeless tobacco: Not on file  . Alcohol Use: Yes    Family History  Problem Relation Age of Onset  . Diabetes Father   . Asthma Daughter   . Heart disease Maternal Grandmother     No Known Allergies  Medication list has been reviewed and updated.  Current Outpatient Prescriptions on File Prior to Visit  Medication Sig Dispense Refill  . amoxicillin-clavulanate (AUGMENTIN) 875-125 MG per tablet Take 1 tablet by mouth 2  (two) times daily. 20 tablet 0  . metroNIDAZOLE (FLAGYL) 500 MG tablet 4 tablets po x 1; do not consume alcohol for two days after taking medication. 4 tablet 0  . promethazine (PHENERGAN) 25 MG tablet 1/2 to 1 tablet every 6 hours as needed for nausea 20 tablet 0   No current facility-administered medications on file prior to visit.    Review of Systems:  As per HPI- otherwise negative.   Physical Examination: Filed Vitals:   09/23/14 1054  BP: 160/100  Pulse: 77  Resp:    There were no vitals filed for this visit. There is no weight on file to calculate BMI. Ideal Body Weight:    GEN: WDWN,  Non-toxic, A & O x 3, obese, in mild to moderate distress   HEENT: Atraumatic, Normocephalic. Neck supple. No masses, No LAD. Ears and Nose: No external deformity. CV: RRR, No M/G/R. No JVD. No thrill. No extra heart sounds. PULM: CTA B, no wheezes, crackles, rhonchi. No retractions. No resp. distress. No accessory muscle use. ABD: S, NT, ND, +BS. No rebound. No HSM. Epigastric tenderness EXTR: No c/c/e NEURO Normal gait.  PSYCH: Normally interactive. Conversant. Not depressed or anxious appearing.  Calm demeanor.   Given a GI cocktail- she did feel  better but pain not resolved. Pain from 10/10 to 5/10 EKG: sinus rhythm.  No acute ST elevation or depression noted   Given asa 81 #4 po once at 10:58 am Applied 2L of oxygen via [place] at approx 11am 22 gauge left hand started at 11:16 am  Assessment and Plan: Chest pain, unspecified chest pain type - Plan: EKG 12-Lead, aspirin chewable tablet 324 mg  SOB (shortness of breath) - Plan: EKG 12-Lead  Here today with CP, hypertension and SOB. She did feel better but not well with GI cocktail.  Less likely because of age but consider CAD.  Also consider PE. Transfer to ED via EMS For further evaluation. EMS arrived at 11:30 am  Signed Abbe AmsterdamJessica Jalessa Peyser, MD

## 2014-09-23 NOTE — Discharge Instructions (Signed)
Chest Pain (Nonspecific) °It is often hard to give a specific diagnosis for the cause of chest pain. There is always a chance that your pain could be related to something serious, such as a heart attack or a blood clot in the lungs. You need to follow up with your health care provider for further evaluation. °CAUSES  °· Heartburn. °· Pneumonia or bronchitis. °· Anxiety or stress. °· Inflammation around your heart (pericarditis) or lung (pleuritis or pleurisy). °· A blood clot in the lung. °· A collapsed lung (pneumothorax). It can develop suddenly on its own (spontaneous pneumothorax) or from trauma to the chest. °· Shingles infection (herpes zoster virus). °The chest wall is composed of bones, muscles, and cartilage. Any of these can be the source of the pain. °· The bones can be bruised by injury. °· The muscles or cartilage can be strained by coughing or overwork. °· The cartilage can be affected by inflammation and become sore (costochondritis). °DIAGNOSIS  °Lab tests or other studies may be needed to find the cause of your pain. Your health care provider may have you take a test called an ambulatory electrocardiogram (ECG). An ECG records your heartbeat patterns over a 24-hour period. You may also have other tests, such as: °· Transthoracic echocardiogram (TTE). During echocardiography, sound waves are used to evaluate how blood flows through your heart. °· Transesophageal echocardiogram (TEE). °· Cardiac monitoring. This allows your health care provider to monitor your heart rate and rhythm in real time. °· Holter monitor. This is a portable device that records your heartbeat and can help diagnose heart arrhythmias. It allows your health care provider to track your heart activity for several days, if needed. °· Stress tests by exercise or by giving medicine that makes the heart beat faster. °TREATMENT  °· Treatment depends on what may be causing your chest pain. Treatment may include: °¨ Acid blockers for  heartburn. °¨ Anti-inflammatory medicine. °¨ Pain medicine for inflammatory conditions. °¨ Antibiotics if an infection is present. °· You may be advised to change lifestyle habits. This includes stopping smoking and avoiding alcohol, caffeine, and chocolate. °· You may be advised to keep your head raised (elevated) when sleeping. This reduces the chance of acid going backward from your stomach into your esophagus. °Most of the time, nonspecific chest pain will improve within 2-3 days with rest and mild pain medicine.  °HOME CARE INSTRUCTIONS  °· If antibiotics were prescribed, take them as directed. Finish them even if you start to feel better. °· For the next few days, avoid physical activities that bring on chest pain. Continue physical activities as directed. °· Do not use any tobacco products, including cigarettes, chewing tobacco, or electronic cigarettes. °· Avoid drinking alcohol. °· Only take medicine as directed by your health care provider. °· Follow your health care provider's suggestions for further testing if your chest pain does not go away. °· Keep any follow-up appointments you made. If you do not go to an appointment, you could develop lasting (chronic) problems with pain. If there is any problem keeping an appointment, call to reschedule. °SEEK MEDICAL CARE IF:  °· Your chest pain does not go away, even after treatment. °· You have a rash with blisters on your chest. °· You have a fever. °SEEK IMMEDIATE MEDICAL CARE IF:  °· You have increased chest pain or pain that spreads to your arm, neck, jaw, back, or abdomen. °· You have shortness of breath. °· You have an increasing cough, or you cough   up blood. °· You have severe back or abdominal pain. °· You feel nauseous or vomit. °· You have severe weakness. °· You faint. °· You have chills. °This is an emergency. Do not wait to see if the pain will go away. Get medical help at once. Call your local emergency services (911 in [place] Do not drive  yourself to the hospital. °MAKE SURE YOU:  °· Understand these instructions. °· Will watch your condition. °· Will get help right away if you are not doing well or get worse. °Document Released: 07/31/2005 Document Revised: 10/26/2013 Document Reviewed: 05/26/2008 °ExitCare® Patient Information ©2015 [employer]. This information is not intended to replace advice given to you by your health care provider. Make sure you discuss any questions you have with your health care provider. ° ° °Emergency Department Resource Guide °1) Find a Doctor and Pay Out of Pocket °Although you won't have to find out who is covered by your insurance plan, it is a good idea to ask around and get recommendations. You will then need to call the office and see if the doctor you have chosen will accept you as a new patient and what types of options they offer for patients who are self-pay. Some doctors offer discounts or will set up payment plans for their patients who do not have insurance, but you will need to ask so you aren't surprised when you get to your appointment. ° °2) Contact Your Local Health Department °Not all health departments have doctors that can see patients for sick visits, but many do, so it is worth a call to see if yours does. If you don't know where your local health department is, you can check in your phone book. The CDC also has a tool to help you locate your state's health department, and many state websites also have listings of all of their local health departments. ° °3) Find a Walk-in Clinic °If your illness is not likely to be very severe or complicated, you may want to try a walk in clinic. These are popping up all over the country in pharmacies, drugstores, and shopping centers. They're usually staffed by nurse practitioners or physician assistants that have been trained to treat common illnesses and complaints. They're usually fairly quick and inexpensive. However, if you have serious medical issues or  chronic medical problems, these are probably not your best option. ° °No Primary Care Doctor: °- Call [facility] at  832-8000 - they can help you locate a primary care doctor that  accepts your insurance, provides certain services, etc. °- Physician Referral Service- 1-800-533-3463 ° °Chronic Pain Problems: °Organization         Address  Phone   Notes  °[facility]  (336) 297-2271 Patients need to be referred by their primary care doctor.  ° °Medication Assistance: °Organization         Address  Phone   Notes  °[place] [facility] [address] °[place], [place] [address] (336) 641-8030 --Must be a resident of [place] °-- Must have NO insurance coverage whatsoever (no Medicaid/ Medicare, etc.) °-- The pt. MUST have a primary care doctor that directs their care regularly and follows them in the community °  °[facility]  (866) 331-1348   °[employer]  (888) 892-1162   ° °Agencies that provide inexpensive medical care: °Organization         Address  Phone   Notes  °[facility]  (  336) 832-8035   °[facility]    (336) 832-7272   °[facility] [address] °[place], [place] [address] (336) 832-4777   °[facility] [address], °[place] (336) 271-4999   °[facility]    (336) 373-0678   °[facility]    (336) 272-1050   °[facility] ° [address], [place] Phone:  (336) 832-4444, Fax:  (336) 832-4440 Hours of Operation:  9 am - 6 pm, M-F.  Also accepts Medicaid/Medicare and self-pay.  °[facility] ° [address], [place] Phone: (336) 832-3150, Fax: (336) 832-3151. Hours of Operation:  8:30 am - 5:30 pm, M-F.  Also accepts Medicaid and self-pay.  °[facility] [place] [address], [place] Phone: (336) 878-6027   °[facility] [address], [place], [place]  (336)723-1848, Ext. 123 Mondays & Thursdays: 7-9 AM.  First 15 patients are seen on a first come, first serve basis. °  ° °Medicaid-accepting [place] Providers: ° °Organization         Address  Phone   Notes  °[facility] [address], [place] (336) 641-2100 Also accepts self-pay patients.  °[facility] [address], [place] ° (336) 856-9996   °[facility] [address], [place] (336) 288-8857   °[facility] [address] [place] [address], [place] (336) 299-7000   °Veita Bland [address], [place]  ° (336) 373-1557 Only accepts [place] Access Medicaid patients after they have their name applied to their card.  ° °Self-Pay (no insurance) in [place]: ° °Organization         Address  Phone   Notes  °Sickle Cell Patients, [facility] [address], [place] (336) 832-1970   °[facility] [address], [place] (336) 832-4400   °[facility] ° [address], [place] (336) 992-4800   °[facility]/Dr. Osei-Bonsu ° [address] [place] [address], [place] or [address], [place] (336) 841-8500 Phone number for both [place] and [place] locations is the same.  °[facility] [address], [place] (336) 299-0000   °[facility] [address] [place] [address], [place] or [address] (336) 852-7530 °(336) 878-2260   °[facility] [address],  (336) 350-1642, phone; (336) 294-5005, fax Sees patients 1st and 3rd Saturday of every month.  Must not qualify for public or private insurance (i.e. Medicaid, Medicare, [place] Health Choice, Veterans' Benefits) • Household income should be no more than 200% of the poverty level •The clinic cannot treat you if you are pregnant or think you are pregnant • Sexually transmitted  diseases are not treated at the clinic.  ° ° °Dental Care: °Organization         Address  Phone  Notes  °[facility] [address],  (336) 641-6152 Accepts children up to age 21 who are enrolled in Medicaid or [place] Health Choice; pregnant women with a Medicaid card; and children who have applied for Medicaid or [place] Health Choice, but were declined, whose parents can pay a reduced fee at time of service.  °[facility] [place]    [address], [place] (336) 641-7733 Accepts children up to age 21 who are enrolled in Medicaid or [place] Health Choice; pregnant women with a Medicaid card; and children who have applied for Medicaid or [place] Health Choice, but were declined, whose parents can pay a reduced fee at time of service.  °[facility] ° [address], [place] (336) 641-4533 Patients are seen by appointment only. Walk-ins are not accepted. [facility] will see patients 18 years of age and older. °Monday - Tuesday (8am-5pm) °Most Wednesdays (8:30-5pm) °$30 per visit, cash only  °[facility] ° [address], [place] (336) 641-4533 Patients are seen by appointment only. Walk-ins are not accepted. [facility] will see patients 18 years of age and older. °One Wednesday Evening (Monthly: Volunteer Based).  $30 per visit, cash only  °[facility]  (919) 537-3737 for adults; Children under age 4, call Graduate Pediatric Dentistry at ([phone]) [phone]. Children aged 4-14, please call (919) [phone] to request a pediatric application. ° Dental services are provided in all areas of dental care including fillings, crowns and bridges, complete and partial dentures, implants, gum treatment, root canals, and extractions. Preventive care is also provided. Treatment is provided to both adults and children. °Patients are selected via a  lottery and there is often a waiting list. °  °[facility] [address], °[place] ° (336) 763-8833 [email or web address] °  °[facility] [address], [place], [place] (336)723-1848, Ext. 123 Second and Fourth Thursday of each month, opens at 6:30 AM; Clinic ends at 9 AM.  Patients are seen on a first-come first-served basis, and a limited number are seen during each clinic.  ° °[facility] ° [address], [place], [place] (336) 723-7904   Eligibility Requirements °You must have lived in [place], [place], or [place] counties for at least the last three months. °  You cannot be eligible for state or federal sponsored healthcare insurance, including Veterans Administration, Medicaid, or Medicare. °  You generally cannot be eligible for healthcare insurance through your employer.  °  How to apply: °Eligibility screenings are held every Tuesday and Wednesday afternoon from 1:00 pm until 4:00 pm. You do not need an appointment for the interview!  °[facility] [address], [place], [place] 336-631-2330   °[facility]  336-342-8273   °[facility]  336-703-3100   °[facility]  336-570-6415   ° °Behavioral Health Resources in the Community: °Intensive Outpatient Programs °Organization         Address  Phone  Notes  °[facility] [address], [place], [place] 336-878-6098   °[facility] [address], [place], [place] 336-832-9800   °[facility] [address], [place], [place] ° 336-882-2125   °[facility] [address],  °[place], [place] 1-800-853-5163 or 336-641-4981   °Substance Abuse Resources °Organization         Address  Phone  Notes  °[facility]  336-882-2125   °[facility]  336-784-9470   °[facility]  336-285-9073   °[facility]  336-845-3988   °Residential &  Outpatient Substance Abuse Program  1-800-659-3381   °Psychological Services °Organization         Address  Phone  Notes  °[facility]  336- 832-9600   °  [facility]  336- 378-7881   °[facility] [address], [place] 1-800-853-5163 or 336-641-4981   ° °Mobile Crisis Teams °Organization         Address  Phone  Notes  °[facility], Mobile Crisis Care Unit  1-877-626-1772   °Assertive °[facility] ° [address] [place], [place] 336-834-9664   °Sharon DeEsch [address] °[place] 336-554-5454   ° °Self-Help/Support Groups °Organization         Address  Phone             Notes  °[facility] - variety of support groups  336- 373-1402 Call for more information  °Narcotics Anonymous (NA), [facility] [address], °[place]  2 meetings at this location  ° °Residential Treatment Programs °Organization         Address  Phone  Notes  °[facility] [address],    °[place]  1-866-801-8205   °[facility] ° [address], [place], [place] 704-293-8524   °[facility] [address], [place] 336-845-3988 Admissions: 8am-3pm M-F  °[facility] [address]    °[place], [place] 336-841-1104   °[facility] [address], [place], [place] 336-379-7146   °[facility] [address]  °[place], [place] 336-285-9073   °[facility] Intensive Outpatient [address], [place], [place] 336-852-3033   °[facility] [address]  °[place], [place] 1-877-615-2722 or 336-784-9470   °[facility] [address] [place], [place] 336-227-7417 Accepts Medicaid  °[facility] [address]  °[place] 1-800-659-3381 Substance Abuse/Addiction Treatment  ° °[place] Behavioral Health Resources °Organization          Address  Phone  Notes  °[facility]  (888) 581-9988   °Julie Brannon, PhD [address] [place], [place]   (336) 349-5553 or (336) 951-0000   °[facility]   [address] °[place], [place] (336) 349-4454   °[facility] [address], [place], [place] (336) 342-8316 Insurance/Medicaid/sponsorship through [facility]  °[facility] [address]                                    [place], [place] (336) 342-8316 Therapy/tele-psych/case  °[facility] [address]  ° [place], [place] (336) 349-2233    °Dr. Arfeen  (336) 349-4544   °[facility] [place]  [employer] [place] [facility] 1) [address],  °2) [address], [place] °3)  [address], [place] (336) 349-3220 °(336) 342-7768 ° °(336) 342-8140   °[place] [facility] (336) 342-1394 or (336) 342-3537 (After Hours)    ° ° ° °

## 2015-06-19 LAB — OB RESULTS CONSOLE HEPATITIS B SURFACE ANTIGEN: HEP B S AG: NEGATIVE

## 2015-06-19 LAB — OB RESULTS CONSOLE GC/CHLAMYDIA
Chlamydia: NEGATIVE
GC PROBE AMP, GENITAL: NEGATIVE

## 2015-06-19 LAB — OB RESULTS CONSOLE RUBELLA ANTIBODY, IGM: RUBELLA: IMMUNE

## 2015-06-19 LAB — OB RESULTS CONSOLE ANTIBODY SCREEN: Antibody Screen: NEGATIVE

## 2015-06-19 LAB — OB RESULTS CONSOLE HIV ANTIBODY (ROUTINE TESTING): HIV: NONREACTIVE

## 2015-06-19 LAB — OB RESULTS CONSOLE ABO/RH: RH TYPE: POSITIVE

## 2015-06-19 LAB — OB RESULTS CONSOLE RPR: RPR: NONREACTIVE

## 2015-09-21 ENCOUNTER — Ambulatory Visit (HOSPITAL_COMMUNITY)
Admission: RE | Admit: 2015-09-21 | Discharge: 2015-09-21 | Disposition: A | Discharge status: Home / Self Care | Payer: Medicaid Other | Source: Ambulatory Visit | Attending: Obstetrics and Gynecology | Admitting: Obstetrics and Gynecology

## 2015-09-21 ENCOUNTER — Encounter (HOSPITAL_COMMUNITY): Payer: Self-pay

## 2015-09-21 ENCOUNTER — Other Ambulatory Visit (HOSPITAL_COMMUNITY): Payer: Self-pay | Admitting: Obstetrics and Gynecology

## 2015-09-21 VITALS — BP 129/82 | HR 84 | Wt 269.2 lb

## 2015-09-21 DIAGNOSIS — O359XX1 Maternal care for (suspected) fetal abnormality and damage, unspecified, fetus 1: Secondary | ICD-10-CM

## 2015-09-21 DIAGNOSIS — O09212 Supervision of pregnancy with history of pre-term labor, second trimester: Secondary | ICD-10-CM

## 2015-09-21 DIAGNOSIS — O09892 Supervision of other high risk pregnancies, second trimester: Secondary | ICD-10-CM

## 2015-09-21 DIAGNOSIS — O358XX0 Maternal care for other (suspected) fetal abnormality and damage, not applicable or unspecified: Secondary | ICD-10-CM | POA: Insufficient documentation

## 2015-09-21 DIAGNOSIS — O09219 Supervision of pregnancy with history of pre-term labor, unspecified trimester: Secondary | ICD-10-CM | POA: Diagnosis not present

## 2015-09-21 DIAGNOSIS — Z3A22 22 weeks gestation of pregnancy: Secondary | ICD-10-CM | POA: Diagnosis not present

## 2015-09-21 DIAGNOSIS — Z315 Encounter for genetic counseling: Secondary | ICD-10-CM | POA: Diagnosis present

## 2015-09-21 DIAGNOSIS — O283 Abnormal ultrasonic finding on antenatal screening of mother: Secondary | ICD-10-CM | POA: Insufficient documentation

## 2015-09-21 DIAGNOSIS — O35HXX0 Maternal care for other (suspected) fetal abnormality and damage, fetal lower extremities anomalies, not applicable or unspecified: Secondary | ICD-10-CM | POA: Insufficient documentation

## 2015-09-21 DIAGNOSIS — Z36 Encounter for antenatal screening of mother: Secondary | ICD-10-CM | POA: Insufficient documentation

## 2015-09-21 DIAGNOSIS — O359XX0 Maternal care for (suspected) fetal abnormality and damage, unspecified, not applicable or unspecified: Secondary | ICD-10-CM

## 2015-09-21 HISTORY — DX: Essential (primary) hypertension: I10

## 2015-09-21 NOTE — Progress Notes (Signed)
Genetic Counseling  High-Risk Gestation Note  Appointment Date:  09/21/2015 Referred By: Paula Compton, MD Date of Birth:  [birth year] Partner:  Cherylynn Ridges   Pregnancy History: [id number] Estimated Date of Delivery: 01/19/16 Estimated Gestational Age: 68w6dAttending: PBenjaman Lobe MD   I met with Ms. CAnn Lionsand her partner, Mr. SCherylynn Ridges for genetic counseling because of abnormal ultrasound findings.  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 KStephenpreferred to defer prenatal orthopedics consult until after follow-up ultrasound  We began by reviewing the ultrasound in detail. 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 Remaining visualized fetal anatomy appeared normal. Complete ultrasound results reported separately.   Clubfoot/feet is a term that actually describes three distinct anomalies (talipes equinovarus, talipes calcaneovalgus, and metatarsus varus) and occurs in 1 in 1000 births. The most common type of clubfeet, talipes equinovarus, is characterized by forefoot adduction with supination, heel varus, and ankle equines, which cannot be brought back to a neutral position. They were counseled that clubfeet can be an isolated difference, occur as a feature of an underlying syndrome, or the result of neurological impairment. We discussed that the most likely mode of inheritance for nonsyndromic, isolated clubfoot/feet is multifactorial. There is a known genetic component for multifactorial clubfoot, as demonstrated by twin studies; environmental factors also play a role, including infection, drugs, and intrauterine environment (oligohydramnios, fetal positioning). While the majority of cases of clubfoot/feet are isolated, it is a feature in more than 200 known genetic syndromes, including both chromosomal and single gene conditions. We  reviewed briefly discussed chromosomes, nondisjunction and examples including Down syndrome, trisomy 159 and 144 The risk for other chromosome aberrations is slightly increased (microdeletions, microduplications, insertions, translocations). We reviewed single gene conditions, which can follow various inheritance patterns.   This couple was counseled regarding the option of noninvasive prenatal screening (NIPS)/cell free fetal DNA (cffDNA) testing.  We then discussed the option of amniocentesis, including the limitations, benefits, and risks. Ms. KStockburgerdeclined NIPS and amniocentesis at this time.   We discussed the option of meeting with a pediatric orthopedic specialist to discuss expectant management and treatment of clubfeet. Karina Rivera possible interest in this but would prefer to wait until after her follow-up ultrasound. Follow-up ultrasound is scheduled for 11/02/15.   Both family histories were briefly reviewed and found to be noncontributory for birth defects, intellectual disability, and known genetic conditions. Without further information regarding the provided family history, an accurate genetic risk cannot be calculated. Further genetic counseling is warranted if more information is obtained.  Ms. KWiesmanwas provided with written information regarding sickle cell anemia (SCA) including the carrier frequency and incidence in the Hispanic/African-American population, the availability of carrier testing and prenatal diagnosis if indicated.  In addition, we discussed that hemoglobinopathies are routinely screened for as part of the [facility] newborn screening panel.  She previously had hemoglobin electrophoresis which was within normal limits.   Ms. KTundenied exposure to environmental toxins or chemical agents. She denied the use of alcohol, tobacco or street drugs. She denied significant viral illnesses during the course of her pregnancy. Her medical and surgical histories were contributory  for high blood pressure, which is currently treated with medication.   I counseled this couple regarding the above risks and available options.  The approximate face-to-face time with the genetic counselor was 20 minutes.  Karina Oman MS Certified Genetic  Counselor 09/21/2015

## 2015-10-13 ENCOUNTER — Encounter (HOSPITAL_COMMUNITY): Payer: Self-pay

## 2015-11-02 ENCOUNTER — Encounter (HOSPITAL_COMMUNITY): Payer: Self-pay

## 2015-11-02 ENCOUNTER — Other Ambulatory Visit (HOSPITAL_COMMUNITY): Payer: Self-pay | Admitting: Maternal and Fetal Medicine

## 2015-11-02 ENCOUNTER — Ambulatory Visit (HOSPITAL_COMMUNITY)
Admission: RE | Admit: 2015-11-02 | Discharge: 2015-11-02 | Disposition: A | Discharge status: Home / Self Care | Payer: Medicaid Other | Source: Ambulatory Visit | Attending: Obstetrics and Gynecology | Admitting: Obstetrics and Gynecology

## 2015-11-02 DIAGNOSIS — O09213 Supervision of pregnancy with history of pre-term labor, third trimester: Secondary | ICD-10-CM | POA: Insufficient documentation

## 2015-11-02 DIAGNOSIS — O09293 Supervision of pregnancy with other poor reproductive or obstetric history, third trimester: Secondary | ICD-10-CM | POA: Insufficient documentation

## 2015-11-02 DIAGNOSIS — O358XX0 Maternal care for other (suspected) fetal abnormality and damage, not applicable or unspecified: Secondary | ICD-10-CM | POA: Insufficient documentation

## 2015-11-02 DIAGNOSIS — Z36 Encounter for antenatal screening of mother: Secondary | ICD-10-CM | POA: Insufficient documentation

## 2015-11-02 DIAGNOSIS — O359XX0 Maternal care for (suspected) fetal abnormality and damage, unspecified, not applicable or unspecified: Secondary | ICD-10-CM

## 2015-11-02 DIAGNOSIS — Z3A28 28 weeks gestation of pregnancy: Secondary | ICD-10-CM

## 2015-11-02 DIAGNOSIS — O09219 Supervision of pregnancy with history of pre-term labor, unspecified trimester: Secondary | ICD-10-CM

## 2015-11-02 DIAGNOSIS — Z8759 Personal history of other complications of pregnancy, childbirth and the puerperium: Secondary | ICD-10-CM

## 2015-11-05 NOTE — L&D Delivery Note (Addendum)
Pt complete and at +2 station with uncontrollable urge to push. Epidural controlling pain. Pt pushed for about 15-20 mins to deliver a viable female infant in ROA position over 2nd degree perineal laceration. Nuchal x 2 reduced over infants head - first was tight and second was loose. Anterior and posterior shoulders spontaneously delivered with next two pushes; body easily followed next.Body cord also noted.  Infant placed on mothers abdomen and bulb suction of mouth and nose performed. Cord was then clamped and cut by FOB. Cord blood obtained. Baby had a vigorous spontaneous cry noted on delivery. Placenta then delivered about 5 mins later intact, 3VC shultz. Fundal massage performed and pitocin per protocol. Fundus firm. Second degree lac repaired with 3-0 vicryl suture. Mother and baby stable. Counts correct

## 2015-12-18 LAB — OB RESULTS CONSOLE ABO/RH

## 2015-12-18 LAB — OB RESULTS CONSOLE GBS: STREP GROUP B AG: POSITIVE

## 2015-12-29 ENCOUNTER — Encounter (HOSPITAL_COMMUNITY): Payer: Self-pay | Admitting: *Deleted

## 2015-12-29 ENCOUNTER — Telehealth (HOSPITAL_COMMUNITY): Payer: Self-pay | Admitting: *Deleted

## 2015-12-29 NOTE — Telephone Encounter (Signed)
Preadmission screenPreadmission screen 

## 2016-01-01 ENCOUNTER — Encounter (HOSPITAL_COMMUNITY): Payer: Self-pay

## 2016-01-01 ENCOUNTER — Inpatient Hospital Stay (HOSPITAL_COMMUNITY)
Admission: RE | Admit: 2016-01-01 | Discharge: 2016-01-04 | DRG: 774 | Disposition: A | Discharge status: Home / Self Care | Payer: Medicaid Other | Source: Ambulatory Visit | Attending: Obstetrics and Gynecology | Admitting: Obstetrics and Gynecology

## 2016-01-01 VITALS — BP 135/90 | HR 83 | Temp 97.7°F | Resp 18 | Ht 72.0 in | Wt 279.0 lb

## 2016-01-01 DIAGNOSIS — O99214 Obesity complicating childbirth: Secondary | ICD-10-CM | POA: Diagnosis present

## 2016-01-01 DIAGNOSIS — Z87891 Personal history of nicotine dependence: Secondary | ICD-10-CM | POA: Diagnosis not present

## 2016-01-01 DIAGNOSIS — O1002 Pre-existing essential hypertension complicating childbirth: Secondary | ICD-10-CM | POA: Diagnosis present

## 2016-01-01 DIAGNOSIS — O358XX0 Maternal care for other (suspected) fetal abnormality and damage, not applicable or unspecified: Secondary | ICD-10-CM

## 2016-01-01 DIAGNOSIS — O99824 Streptococcus B carrier state complicating childbirth: Secondary | ICD-10-CM | POA: Diagnosis present

## 2016-01-01 DIAGNOSIS — Z3A37 37 weeks gestation of pregnancy: Secondary | ICD-10-CM | POA: Diagnosis not present

## 2016-01-01 DIAGNOSIS — O114 Pre-existing hypertension with pre-eclampsia, complicating childbirth: Secondary | ICD-10-CM | POA: Diagnosis present

## 2016-01-01 DIAGNOSIS — O1490 Unspecified pre-eclampsia, unspecified trimester: Secondary | ICD-10-CM | POA: Diagnosis present

## 2016-01-01 DIAGNOSIS — O35HXX0 Maternal care for other (suspected) fetal abnormality and damage, fetal lower extremities anomalies, not applicable or unspecified: Secondary | ICD-10-CM

## 2016-01-01 DIAGNOSIS — Z8249 Family history of ischemic heart disease and other diseases of the circulatory system: Secondary | ICD-10-CM | POA: Diagnosis not present

## 2016-01-01 DIAGNOSIS — Z6837 Body mass index (BMI) 37.0-37.9, adult: Secondary | ICD-10-CM | POA: Diagnosis not present

## 2016-01-01 LAB — CBC
HCT: 33.4 % — ABNORMAL LOW (ref 36.0–46.0)
HEMOGLOBIN: 11 g/dL — AB (ref 12.0–15.0)
MCH: 28 pg (ref 26.0–34.0)
MCHC: 32.9 g/dL (ref 30.0–36.0)
MCV: 85 fL (ref 78.0–100.0)
PLATELETS: 189 10*3/uL (ref 150–400)
RBC: 3.93 MIL/uL (ref 3.87–5.11)
RDW: 16.1 % — ABNORMAL HIGH (ref 11.5–15.5)
WBC: 7.8 10*3/uL (ref 4.0–10.5)

## 2016-01-01 LAB — TYPE AND SCREEN
ABO/RH(D): O POS
ANTIBODY SCREEN: NEGATIVE

## 2016-01-01 LAB — ABO/RH: ABO/RH(D): O POS

## 2016-01-01 LAB — HEPATIC FUNCTION PANEL
ALT: 14 U/L (ref 14–54)
AST: 17 U/L (ref 15–41)
Albumin: 3 g/dL — ABNORMAL LOW (ref 3.5–5.0)
Alkaline Phosphatase: 155 U/L — ABNORMAL HIGH (ref 38–126)
BILIRUBIN DIRECT: 0.1 mg/dL (ref 0.1–0.5)
BILIRUBIN INDIRECT: 0.8 mg/dL (ref 0.3–0.9)
BILIRUBIN TOTAL: 0.9 mg/dL (ref 0.3–1.2)
Total Protein: 6.6 g/dL (ref 6.5–8.1)

## 2016-01-01 MED ORDER — LACTATED RINGERS IV SOLN
INTRAVENOUS | Status: DC
  Administered 2016-01-01 (×2): via INTRAVENOUS

## 2016-01-01 MED ORDER — OXYTOCIN 10 UNIT/ML IJ SOLN
1.0000 m[IU]/min | INTRAMUSCULAR | Status: DC
  Administered 2016-01-01: 2 m[IU]/min via INTRAVENOUS

## 2016-01-01 MED ORDER — MISOPROSTOL 25 MCG QUARTER TABLET
25.0000 ug | ORAL_TABLET | ORAL | Status: DC | PRN
  Administered 2016-01-01 (×3): 25 ug via VAGINAL
  Filled 2016-01-01 (×4): qty 0.25
  Filled 2016-01-01: qty 1

## 2016-01-01 MED ORDER — BUTORPHANOL TARTRATE 1 MG/ML IJ SOLN: 1.0000 mg | INTRAMUSCULAR | Status: DC | PRN

## 2016-01-01 MED ORDER — OXYTOCIN 10 UNIT/ML IJ SOLN
2.5000 [IU]/h | INTRAMUSCULAR | Status: DC
  Filled 2016-01-01: qty 4

## 2016-01-01 MED ORDER — CITRIC ACID-SODIUM CITRATE 334-500 MG/5ML PO SOLN: 30.0000 mL | ORAL | Status: DC | PRN

## 2016-01-01 MED ORDER — OXYTOCIN BOLUS FROM INFUSION
500.0000 mL | INTRAVENOUS | Status: DC
  Administered 2016-01-02: 500 mL via INTRAVENOUS

## 2016-01-01 MED ORDER — ACETAMINOPHEN 325 MG PO TABS
650.0000 mg | ORAL_TABLET | ORAL | Status: DC | PRN
  Administered 2016-01-01 (×3): 650 mg via ORAL
  Filled 2016-01-01 (×3): qty 2

## 2016-01-01 MED ORDER — PENICILLIN G POTASSIUM 5000000 UNITS IJ SOLR
5.0000 10*6.[IU] | Freq: Once | INTRAVENOUS | Status: AC
  Administered 2016-01-01: 5 10*6.[IU] via INTRAVENOUS
  Filled 2016-01-01: qty 5

## 2016-01-01 MED ORDER — PENICILLIN G POTASSIUM 5000000 UNITS IJ SOLR
2.5000 10*6.[IU] | INTRAVENOUS | Status: DC
  Administered 2016-01-01 – 2016-01-02 (×4): 2.5 10*6.[IU] via INTRAVENOUS
  Filled 2016-01-01 (×8): qty 2.5

## 2016-01-01 MED ORDER — NIFEDIPINE 10 MG PO CAPS
30.0000 mg | ORAL_CAPSULE | Freq: Once | ORAL | Status: AC
  Administered 2016-01-01: 30 mg via ORAL
  Filled 2016-01-01: qty 3

## 2016-01-01 MED ORDER — OXYCODONE-ACETAMINOPHEN 5-325 MG PO TABS: 1.0000 | ORAL_TABLET | ORAL | Status: DC | PRN

## 2016-01-01 MED ORDER — SODIUM CHLORIDE 0.9% FLUSH: 3.0000 mL | INTRAVENOUS | Status: DC | PRN

## 2016-01-01 MED ORDER — LIDOCAINE HCL (PF) 1 % IJ SOLN
30.0000 mL | INTRAMUSCULAR | Status: AC | PRN
  Administered 2016-01-02: 30 mL via SUBCUTANEOUS
  Filled 2016-01-01: qty 30

## 2016-01-01 MED ORDER — NIFEDIPINE ER OSMOTIC RELEASE 30 MG PO TB24
60.0000 mg | ORAL_TABLET | Freq: Every day | ORAL | Status: DC
  Administered 2016-01-02 – 2016-01-03 (×2): 60 mg via ORAL
  Filled 2016-01-01 (×2): qty 2
  Filled 2016-01-01: qty 1

## 2016-01-01 MED ORDER — TERBUTALINE SULFATE 1 MG/ML IJ SOLN: 0.2500 mg | Freq: Once | INTRAMUSCULAR | Status: DC | PRN

## 2016-01-01 MED ORDER — NIFEDIPINE 10 MG PO CAPS
30.0000 mg | ORAL_CAPSULE | Freq: Two times a day (BID) | ORAL | Status: DC
  Administered 2016-01-01: 30 mg via ORAL
  Filled 2016-01-01: qty 3

## 2016-01-01 MED ORDER — SODIUM CHLORIDE 0.9 % IV SOLN: 250.0000 mL | INTRAVENOUS | Status: DC | PRN

## 2016-01-01 MED ORDER — SODIUM CHLORIDE 0.9% FLUSH: 3.0000 mL | Freq: Two times a day (BID) | INTRAVENOUS | Status: DC

## 2016-01-01 MED ORDER — TERBUTALINE SULFATE 1 MG/ML IJ SOLN
0.2500 mg | Freq: Once | INTRAMUSCULAR | Status: DC | PRN
Start: 2016-01-01 — End: 2016-01-02
  Filled 2016-01-01: qty 1

## 2016-01-01 MED ORDER — ONDANSETRON HCL 4 MG/2ML IJ SOLN: 4.0000 mg | Freq: Four times a day (QID) | INTRAMUSCULAR | Status: DC | PRN

## 2016-01-01 MED ORDER — OXYCODONE-ACETAMINOPHEN 5-325 MG PO TABS: 2.0000 | ORAL_TABLET | ORAL | Status: DC | PRN

## 2016-01-01 MED ORDER — LACTATED RINGERS IV SOLN: 500.0000 mL | INTRAVENOUS | Status: DC | PRN

## 2016-01-01 NOTE — Progress Notes (Signed)
Patient ID: Darden Dates, female   DOB: 1986/08/03, 30 y.o.   MRN: [id number] Pt feels well. No HA, blurry vision, VB or LOF. Appreciating some contractions. +Fms BP 140s/90s until after my cervical check then 163/107 EFM- 145, -decels, +accels, mod variability, cat 1 TOCO- irregular contractions and irritability SVE 1.5/70/-2 mid position  A/P: G2P0101 at 37 3/7wks with chtn with superimposed preE         Will recheck bp in ; due for procardia at 10pm         If bp remains elevated will start on MgSO4         Third dose of cytotec placed- will recheck in 4hours or prn

## 2016-01-01 NOTE — Plan of Care (Signed)
Spoke with Dr. Mindi Slicker about pts BP. Pt states she took labeatlol  po this am at 0600. Mindi Slicker states pt is supposed to be taking procardia will dose pt with procardia and recheck bp in .

## 2016-01-01 NOTE — Progress Notes (Signed)
Patient ID: Karina Rivera, female   DOB: Apr 22, 1986, 30 y.o.   MRN: [id number] Pt appreciating contractions. No VB or LOF VSS- last BP 131/75 EFM- 150s TOCO - needs adjusting SVE- 1.5/70/-2  A/P: cervix unchanged         Will start induction with pitocin         S/p cytotec x 3        Plan for svd

## 2016-01-01 NOTE — Progress Notes (Signed)
Patient ID: Karina Rivera, female   DOB: 08-11-1986, 30 y.o.   MRN: [id number] Pt doing well. Denies any HAs, blurry vision, contractions or LOF. Pt is appreciating FMs. VSS EFM - 150, moderate variability, no decels, cat 1 TOCO - + irritability only SVE 1/70/-3  A/P: G2P0101 at 37 3/7wks with cHTN /preE         BP controlled with procardia         Will receive second dos of cytotec - will recheck in 4hrs or prn         Pain control prn         GBS in active labor

## 2016-01-01 NOTE — Progress Notes (Signed)
Patient ID: Karina Rivera, female   DOB: Aug 29, 1986, 30 y.o.   MRN: [id number] Pt appreciating contractions. No Has, blurry vision or LOF BP 150s/90s EFM- 145, moderate variability, no decels, cat 1 Toco- 1-30mins SVE- deferred  A/P: G2P0101 at 38 3/7wks with chtn and superimposed preE- stable         S/p second procardia         Will recheck cervix in 1-2hours         Plans for svd

## 2016-01-01 NOTE — H&P (Signed)
Karina Rivera is a 30 y.o. female presenting for scheduled iol due to chtn with superimposed preecclampsia. Pt was noted to have elevated BP at initial ob visit - 9wks. She has been on several different bp meds during pregnancy with varying results. She has tried labetalol, aldomet and procardia. Most recently has been on procardia. 24hr urine collection obtained 2weeks ago was noted at 358 (baseline was 140). Pt has remained asymptomatic to dte - denies any headaches, blurry vision or abnl weight gain ( 22lbs in pregnancy). She has no change in symptoms this am. She is appreciating fetal movements. Denies contractions, VB or LOF. Pt delivered last child at 34weeks due to  preE Maternal Medical History:  Reason for admission: Nausea. Scheduled iol for chtn with superimposed preE  Fetal activity: Perceived fetal activity is normal.   Last perceived fetal movement was within the past hour.    Prenatal complications: PIH and pre-eclampsia.     OB History    Gravida Para Term Preterm AB TAB SAB Ectopic Multiple Living   Past Medical History  Diagnosis Date  . Hypertension    Past Surgical History  Procedure Laterality Date  . No past surgeries     Family History: family history includes Asthma in her daughter; Diabetes in her father; Heart disease in her maternal grandmother. Social History:  reports that she has quit smoking. She has never used smokeless tobacco. She reports that she does not drink alcohol or use illicit drugs.   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  Eyes: Negative for blurred vision, double vision and photophobia.  Respiratory: Negative for shortness of breath.   Cardiovascular: Negative for chest pain, palpitations and leg swelling.  Gastrointestinal: Negative for heartburn, nausea, vomiting and abdominal pain.  Genitourinary: Negative for dysuria.  Musculoskeletal: Negative for back pain.  Neurological: Negative for dizziness, tingling, sensory change, speech change, seizures and headaches.  Psychiatric/Behavioral: Negative for depression and hallucinations. The patient is not nervous/anxious.     Dilation: 1 Effacement (%): 70 Station: -3 Exam by:: Jeison Delpilar Blood pressure 128/76, pulse 94, temperature 99.1 F (37.3 C), temperature source Oral, height 6' (1.829 m), weight 279 lb (126.554 kg), last menstrual period 04/14/2015. Maternal Exam:  Uterine Assessment: Contraction frequency is rare.   Abdomen: Patient reports no abdominal tenderness. Estimated fetal weight is AGA.   Fetal presentation: vertex  Introitus: Normal vulva. Normal vagina.  Pelvis: adequate for delivery.   Cervix: Cervix evaluated by digital exam.     Physical Exam  Constitutional: She is oriented to person, place, and time. She appears well-developed and well-nourished.  HENT:  Head: Normocephalic.  Neck: Normal range of motion.  Respiratory: Effort normal.  GI: Soft.  Genitourinary: Vagina normal and uterus normal.  Musculoskeletal: Normal range of motion. She exhibits no edema or tenderness.  Neurological: She is alert and oriented to person, place, and time.  Psychiatric: She has a normal mood and affect. Her behavior is normal. Judgment and thought content normal.    Prenatal  labs: ABO, Rh: --/--/O POS, O POS (02/27 0845) Antibody: NEG (02/27 0845) Rubella: Immune (08/15 0000) RPR: Nonreactive (08/15 0000)  HBsAg: Negative (08/15 0000)  HIV: Non-reactive  (08/15 0000)  GBS: Positive (02/13 0000)   Assessment/Plan: [id number] at 37 3/[redacted]wks gestation with cHTN superimposed with preecclampsia here for scheduled iol.  Will ripen with cytotec Will reassess in 4hrs Pain control prn BP control with meds- will start MgSO4 for seizure prophylaxis if indicated May eat now then thin fluids Will prophylax for GBS when in active labor Anticipate svd    Sharol Given Shaquil Aldana 01/01/2016, 1:10 PM

## 2016-01-02 ENCOUNTER — Encounter (HOSPITAL_COMMUNITY): Payer: Self-pay

## 2016-01-02 ENCOUNTER — Inpatient Hospital Stay (HOSPITAL_COMMUNITY): Payer: Medicaid Other | Admitting: Anesthesiology

## 2016-01-02 LAB — CBC
HCT: 32.8 % — ABNORMAL LOW (ref 36.0–46.0)
HEMATOCRIT: 33.3 % — AB (ref 36.0–46.0)
HEMOGLOBIN: 10.8 g/dL — AB (ref 12.0–15.0)
HEMOGLOBIN: 11 g/dL — AB (ref 12.0–15.0)
MCH: 27.8 pg (ref 26.0–34.0)
MCH: 28.1 pg (ref 26.0–34.0)
MCHC: 32.9 g/dL (ref 30.0–36.0)
MCHC: 33 g/dL (ref 30.0–36.0)
MCV: 84.3 fL (ref 78.0–100.0)
MCV: 84.9 fL (ref 78.0–100.0)
PLATELETS: 190 10*3/uL (ref 150–400)
Platelets: 194 10*3/uL (ref 150–400)
RBC: 3.89 MIL/uL (ref 3.87–5.11)
RBC: 3.92 MIL/uL (ref 3.87–5.11)
RDW: 16 % — AB (ref 11.5–15.5)
RDW: 16 % — ABNORMAL HIGH (ref 11.5–15.5)
WBC: 11.2 10*3/uL — ABNORMAL HIGH (ref 4.0–10.5)
WBC: 9.6 10*3/uL (ref 4.0–10.5)

## 2016-01-02 LAB — RPR: RPR Ser Ql: NONREACTIVE

## 2016-01-02 MED ORDER — EPHEDRINE 5 MG/ML INJ
10.0000 mg | INTRAVENOUS | Status: DC | PRN
  Filled 2016-01-02: qty 2

## 2016-01-02 MED ORDER — ONDANSETRON HCL 4 MG PO TABS
4.0000 mg | ORAL_TABLET | ORAL | Status: DC | PRN
Start: 2016-01-02 — End: 2016-01-04

## 2016-01-02 MED ORDER — LANOLIN HYDROUS EX OINT: TOPICAL_OINTMENT | CUTANEOUS | Status: DC | PRN

## 2016-01-02 MED ORDER — DIPHENHYDRAMINE HCL 25 MG PO CAPS: 25.0000 mg | ORAL_CAPSULE | Freq: Four times a day (QID) | ORAL | Status: DC | PRN

## 2016-01-02 MED ORDER — PHENYLEPHRINE 40 MCG/ML (10ML) SYRINGE FOR IV PUSH (FOR BLOOD PRESSURE SUPPORT)
80.0000 ug | PREFILLED_SYRINGE | INTRAVENOUS | Status: DC | PRN
  Filled 2016-01-02: qty 20
  Filled 2016-01-02: qty 2

## 2016-01-02 MED ORDER — DIBUCAINE 1 % RE OINT: 1.0000 "application " | TOPICAL_OINTMENT | RECTAL | Status: DC | PRN

## 2016-01-02 MED ORDER — BENZOCAINE-MENTHOL 20-0.5 % EX AERO
1.0000 | INHALATION_SPRAY | CUTANEOUS | Status: DC | PRN
Start: 2016-01-02 — End: 2016-01-04

## 2016-01-02 MED ORDER — FENTANYL 2.5 MCG/ML BUPIVACAINE 1/10 % EPIDURAL INFUSION (WH - ANES)
14.0000 mL/h | INTRAMUSCULAR | Status: DC | PRN
  Administered 2016-01-02: 14 mL/h via EPIDURAL
  Filled 2016-01-02: qty 125

## 2016-01-02 MED ORDER — EPHEDRINE 5 MG/ML INJ: 10.0000 mg | INTRAVENOUS | Status: DC | PRN

## 2016-01-02 MED ORDER — SIMETHICONE 80 MG PO CHEW: 80.0000 mg | CHEWABLE_TABLET | ORAL | Status: DC | PRN

## 2016-01-02 MED ORDER — PHENYLEPHRINE 40 MCG/ML (10ML) SYRINGE FOR IV PUSH (FOR BLOOD PRESSURE SUPPORT): 80.0000 ug | PREFILLED_SYRINGE | INTRAVENOUS | Status: DC | PRN

## 2016-01-02 MED ORDER — ZOLPIDEM TARTRATE 5 MG PO TABS: 5.0000 mg | ORAL_TABLET | Freq: Every evening | ORAL | Status: DC | PRN

## 2016-01-02 MED ORDER — DIPHENHYDRAMINE HCL 50 MG/ML IJ SOLN: 12.5000 mg | INTRAMUSCULAR | Status: DC | PRN

## 2016-01-02 MED ORDER — PHENYLEPHRINE 40 MCG/ML (10ML) SYRINGE FOR IV PUSH (FOR BLOOD PRESSURE SUPPORT)
80.0000 ug | PREFILLED_SYRINGE | INTRAVENOUS | Status: DC | PRN
  Filled 2016-01-02: qty 2

## 2016-01-02 MED ORDER — PRENATAL MULTIVITAMIN CH
1.0000 | ORAL_TABLET | Freq: Every day | ORAL | Status: DC
  Administered 2016-01-02 – 2016-01-03 (×2): 1 via ORAL
  Filled 2016-01-02 (×2): qty 1

## 2016-01-02 MED ORDER — LIDOCAINE HCL (PF) 1 % IJ SOLN
INTRAMUSCULAR | Status: DC | PRN
  Administered 2016-01-02: 3 mL
  Administered 2016-01-02: 5 mL
  Administered 2016-01-02: 2 mL via EPIDURAL

## 2016-01-02 MED ORDER — WITCH HAZEL-GLYCERIN EX PADS: 1.0000 "application " | MEDICATED_PAD | CUTANEOUS | Status: DC | PRN

## 2016-01-02 MED ORDER — LACTATED RINGERS IV SOLN: 500.0000 mL | Freq: Once | INTRAVENOUS | Status: DC

## 2016-01-02 MED ORDER — TETANUS-DIPHTH-ACELL PERTUSSIS 5-2.5-18.5 LF-MCG/0.5 IM SUSP: 0.5000 mL | Freq: Once | INTRAMUSCULAR | Status: DC

## 2016-01-02 MED ORDER — LABETALOL HCL 5 MG/ML IV SOLN
20.0000 mg | Freq: Once | INTRAVENOUS | Status: AC
  Administered 2016-01-02: 20 mg via INTRAVENOUS
  Filled 2016-01-02: qty 4

## 2016-01-02 MED ORDER — ACETAMINOPHEN 325 MG PO TABS
650.0000 mg | ORAL_TABLET | ORAL | Status: DC | PRN
Start: 2016-01-02 — End: 2016-01-04

## 2016-01-02 MED ORDER — IBUPROFEN 600 MG PO TABS
600.0000 mg | ORAL_TABLET | Freq: Four times a day (QID) | ORAL | Status: DC
  Administered 2016-01-02 – 2016-01-04 (×10): 600 mg via ORAL
  Filled 2016-01-02 (×10): qty 1

## 2016-01-02 MED ORDER — SENNOSIDES-DOCUSATE SODIUM 8.6-50 MG PO TABS
2.0000 | ORAL_TABLET | ORAL | Status: DC
  Administered 2016-01-02 – 2016-01-04 (×2): 2 via ORAL
  Filled 2016-01-02 (×2): qty 2

## 2016-01-02 MED ORDER — ONDANSETRON HCL 4 MG/2ML IJ SOLN: 4.0000 mg | INTRAMUSCULAR | Status: DC | PRN

## 2016-01-02 NOTE — Progress Notes (Signed)
Patient ID: Karina Rivera, female   DOB: [birth year], 30 y.o.   MRN: [id number] DOD  Pt doing well.  No HA and no PIH sx  BP was 176/102, given IV labetalol x 1 and Procardia  XL  Responded well and now 146/87, will follow today to see if procardia will cover  abd soft Reflexes 2+  Pt s/p NSVD with CHTN and superimposed mild preeclampsia Follow BP and adjust meds as needed RN to remove epidural Plans circumcision in office

## 2016-01-02 NOTE — Anesthesia Procedure Notes (Signed)
Epidural Patient location during procedure: OB  Staffing Anesthesiologist: Marcene Duos Performed by: anesthesiologist   Preanesthetic Checklist Completed: patient identified, site marked, surgical consent, pre-op evaluation, timeout performed, IV checked, risks and benefits discussed and monitors and equipment checked  Epidural Patient position: sitting Prep: site prepped and draped and DuraPrep Patient monitoring: continuous pulse ox and blood pressure Approach: midline Location: L4-L5 Injection technique: LOR saline  Needle:  Needle type: Tuohy  Needle gauge: 17 G Needle length: 9 cm and 9 Needle insertion depth: 7 cm Catheter type: closed end flexible Catheter size: 19 Gauge Catheter at skin depth: 14 (12 initially at the skin. Advanced to 14 when placed in lateral decubitus position.) cm Test dose: negative  Assessment Events: blood not aspirated, injection not painful, no injection resistance, negative IV test and no paresthesia

## 2016-01-02 NOTE — Lactation Note (Signed)
This note was copied from a baby's chart. Lactation Consultation Note  Initial visit made.  Breastfeeding consultation services and support information reviewed and given to patient.  Mom states newborn is latching and feeding well.  Baby is currently sleeping and just finished a feeding.  Instructed to feed with any feeding cue and to call for assist or concerns prn.  Patient Name: Karina Rivera ZOXWR'U Date: 01/02/2016 Reason for consult: Initial assessment   Maternal Data    Feeding Feeding Type: Breast Fed  LATCH Score/Interventions Latch: Grasps breast easily, tongue down, lips flanged, rhythmical sucking.  Audible Swallowing: A few with stimulation  Type of Nipple: Everted at rest and after stimulation  Comfort (Breast/Nipple): Soft / non-tender     Hold (Positioning): Assistance needed to correctly position infant at breast and maintain latch.  LATCH Score: 8  Lactation Tools Discussed/Used     Consult Status Consult Status: Follow-up Date: 01/03/16 Follow-up type: In-patient    Huston Foley 01/02/2016, 12:01 PM

## 2016-01-02 NOTE — Progress Notes (Signed)
Spoke with Dr. Senaida Ores regarding BP of 176/102. Orders received to give  labetalol IV and to start Procardia dose now. MD will be in to see pt.

## 2016-01-02 NOTE — Anesthesia Postprocedure Evaluation (Signed)
Anesthesia Post Note  Patient: Karina Rivera  Procedure(s) Performed: * No procedures listed *  Patient location during evaluation: Mother Baby Anesthesia Type: Epidural Level of consciousness: awake, awake and alert, oriented and patient cooperative Pain management: pain level controlled Vital Signs Assessment: post-procedure vital signs reviewed and stable Respiratory status: spontaneous breathing, nonlabored ventilation and respiratory function stable Cardiovascular status: stable Postop Assessment: no headache, no backache, patient able to bend at knees and no signs of nausea or vomiting Anesthetic complications: no    Last Vitals:  Filed Vitals:   01/02/16 0552 01/02/16 0620  BP: 158/108 147/100  Pulse: 81 84  Temp: 37.2 C 36.9 C  Resp: 18 20    Last Pain:  Filed Vitals:   01/02/16 0629  PainSc: 0-No pain                 Doll Frazee L

## 2016-01-02 NOTE — Anesthesia Preprocedure Evaluation (Addendum)
Anesthesia Evaluation  Patient identified by MRN, date of birth, ID band Patient awake    Reviewed: Allergy & Precautions, Patient's Chart, lab work & pertinent test results  Airway Mallampati: III       Dental   Pulmonary former smoker,    Pulmonary exam normal        Cardiovascular hypertension (cHTN with superimposed pre-E), Normal cardiovascular exam     Neuro/Psych negative neurological ROS     GI/Hepatic negative GI ROS, Neg liver ROS,   Endo/Other  Morbid obesity  Renal/GU negative Renal ROS     Musculoskeletal   Abdominal   Peds  Hematology negative hematology ROS (+)   Anesthesia Other Findings   Reproductive/Obstetrics (+) Pregnancy                            Lab Results  Component Value Date   WBC 9.6 01/02/2016   HGB 11.0* 01/02/2016   HCT 33.3* 01/02/2016   MCV 84.9 01/02/2016   PLT 194 01/02/2016   Lab Results  Component Value Date   CREATININE 0.80 09/23/2014   BUN 8 09/23/2014   NA 139 09/23/2014   K 3.8 09/23/2014   CL 102 09/23/2014   CO2 27 09/23/2014    Anesthesia Physical Anesthesia Plan  ASA: III  Anesthesia Plan: Epidural   Post-op Pain Management:    Induction:   Airway Management Planned: Natural Airway  Additional Equipment:   Intra-op Plan:   Post-operative Plan:   Informed Consent: I have reviewed the patients History and Physical, chart, labs and discussed the procedure including the risks, benefits and alternatives for the proposed anesthesia with the patient or authorized representative who has indicated his/her understanding and acceptance.     Plan Discussed with:   Anesthesia Plan Comments:         Anesthesia Quick Evaluation

## 2016-01-02 NOTE — Progress Notes (Signed)
Patient ID: Karina Rivera, female   DOB: June 17, 1986, 30 y.o.   MRN: [id number] Pt spontaneously ruptured with clear fluid and subsequently got epidural- now comfortable. No COmplaints. No HAs or blurry vision VS- 158/98 EFM- 145, moderate variability, +accels cat 1 TOCO - irregular contractions q 1-32mins SVE - 3/80/-2  A/P: IUP at 37 4/[redacted]wks gestation with chtn and superimposed preE - comfortable with epidural         Continue watching BP         Pitocin per protocol         Anticipate svd

## 2016-01-02 NOTE — Progress Notes (Signed)
Patient ID: Karina Rivera, female   DOB: 22-Apr-1986, 30 y.o.   MRN: [id number] Pt c/o increased pelvic pressure SVE 8-9cm dil/+!  Plan - continue current management             bp stable - continue to monitor            Anticipate svd

## 2016-01-02 NOTE — Progress Notes (Signed)
Spoke with Dr. Mindi Slicker regarding BP of 147/100. MD said to start procardia at 10am and to call if BP greater than 165/105.

## 2016-01-03 LAB — CBC
HEMATOCRIT: 28.1 % — AB (ref 36.0–46.0)
HEMOGLOBIN: 9.2 g/dL — AB (ref 12.0–15.0)
MCH: 27.9 pg (ref 26.0–34.0)
MCHC: 32.7 g/dL (ref 30.0–36.0)
MCV: 85.2 fL (ref 78.0–100.0)
Platelets: 173 10*3/uL (ref 150–400)
RBC: 3.3 MIL/uL — ABNORMAL LOW (ref 3.87–5.11)
RDW: 16.4 % — ABNORMAL HIGH (ref 11.5–15.5)
WBC: 9.6 10*3/uL (ref 4.0–10.5)

## 2016-01-03 NOTE — Lactation Note (Signed)
This note was copied from a baby's chart. Lactation Consultation Note  Mom requested assistance with BF because she is concerned that Chance is not transferring at the breast. He attaches to the breast, suckles briefly and slides off the breast.  Some swallows are noted. WHen he comes off the breast he is crying and hungry. He did not easily suck on a gloved finger and tongue thrusting was noted. A frenum is felt when a finger sweep was done. It is also noted that his tongue comes to a point with extension.  Spoon feeding was attempted and Chance ate about 1 ml but quickly became frustrated.  A nipple shield #24 was introduced and he would not attach to it.  Mom was set up with a double electric breast pump and she expressed 5 ml which was finger fed to Chance. It was pushed as he did not transfer it independently.  Once he ate he opened his eyes and began to look around.  An SNS may be tried at the breast with the next feeding. Plan for now is to offer the breast,post-pump and feed any amount back to Chance. Follow-up planned. Patient Name: Karina Rivera ZOXWR'U Date: 01/03/2016     Maternal Data    Feeding    LATCH Score/Interventions                      Lactation Tools Discussed/Used     Consult Status      Soyla Dryer 01/03/2016, 11:31 AM

## 2016-01-03 NOTE — Progress Notes (Signed)
Patient ID: Darden Dates, female   DOB: 1985-12-18, 30 y.o.   MRN: [id number] Pt doing well. No headaches or blurry vision. Lochia mild. Eating and voiding well. Able to ambulate without lightheadedness VSS- Bp in 140s/80-90s  ABD- soft, FF EXT - no Homans  A/P: PPD #1         chtn with superimposed preE - stable bp on procardia 60xl        Routine pp care

## 2016-01-04 MED ORDER — NIFEDIPINE ER 60 MG PO TB24: 60.0000 mg | ORAL_TABLET | Freq: Every day | ORAL | Status: DC

## 2016-01-04 MED ORDER — IBUPROFEN 600 MG PO TABS: 600.0000 mg | ORAL_TABLET | Freq: Four times a day (QID) | ORAL | Status: DC | PRN

## 2016-01-04 NOTE — Lactation Note (Signed)
This note was copied from a baby's chart. Lactation Consultation Note: Mother states that infant is feeding much better this am. Mother denies having any nipple tenderness at this time. She is aware to get her infant to open his mouth with a wide gape. Mother has a hand pump and an electric pump at home. Mother advised to continue to cue base feed and feed at least 8-12 times/24 hours. Reviewed treatment plan to prevent engorgement. Mother is aware of available LC services and community support.   Patient Name: Karina Rivera Date: 01/04/2016 Reason for consult: Follow-up assessment   Maternal Data    Feeding Feeding Type: Breast Fed Length of feed: 30 min (per mother)  [facility] Score/Interventions                      Lactation Tools Discussed/Used     Consult Status Consult Status: Complete    Michel Bickers 01/04/2016, 11:50 AM

## 2016-01-04 NOTE — Lactation Note (Signed)
This note was copied from a baby's chart. Lactation Consultation Note Charted on wrong pt. Patient Name: Karina Rivera IRSWN'I Date: 01/04/2016 Reason for consult: Follow-up assessment   Maternal Data    Feeding Feeding Type: Formula Nipple Type: Slow - flow Length of feed: 30 min  LATCH Score/Interventions                      Lactation Tools Discussed/Used Tools: Bottle   Consult Status Consult Status: Follow-up Date: 01/04/16 Follow-up type: In-patient    Yanixan Mellinger, Diamond Nickel 01/04/2016, 6:03 AM

## 2016-01-04 NOTE — Discharge Summary (Signed)
OB Discharge Summary     Patient Name: Karina Rivera DOB: [birth year] MRN: [id number]  Date of admission: 01/01/2016 Delivering MD: [facility]   Date of discharge: 01/04/2016  Admitting diagnosis: INDUCTION Intrauterine pregnancy: [redacted]w[redacted]d     Secondary diagnosis:  Active Problems:   Preeclampsia   Postpartum care following vaginal delivery  Additional problems: none     Discharge diagnosis: Term Pregnancy Delivered and CHTN with superimposed preeclampsia                                                                                                Post partum procedures:none  Augmentation: Pitocin and Cytotec  Complications: None  Hospital course:  Induction of Labor With Vaginal Delivery   30 y.o. yo [id number] at [redacted]w[redacted]d was admitted to the hospital 01/01/2016 for induction of labor.  Indication for induction: chtn with superimposed preecclampsia.  Patient had an uncomplicated labor course as follows: Membrane Rupture Time/Date: 11:45 PM ,01/01/2016   Intrapartum Procedures: Episiotomy: None [1]                                         Lacerations:  2nd degree [3];Perineal [11]  Patient had delivery of a Viable infant.  Information for the patient's newborn:  Deandrea, Vanpelt [[id number]]  Delivery Method: Vaginal, Spontaneous Delivery (Filed from Delivery Summary)   01/02/2016  Details of delivery can be found in separate delivery note.  Patient had a routine postpartum course. Patient is discharged home 01/04/2016.   Physical exam  Filed Vitals:   01/03/16 2030 01/04/16 0040 01/04/16 0440 01/04/16 0804  BP: 147/90 141/77 135/76 135/90  Pulse: 88 78 81 83  Temp: 98.4 F (36.9 C) 98.3 F (36.8 C) 97.7 F (36.5 C)   TempSrc: Oral Oral Oral   Resp: Height:      Weight:      SpO2: 100% 100% 100%    General: alert, cooperative and no distress Lochia: appropriate Uterine Fundus: firm Incision: N/A DVT Evaluation: No evidence of DVT seen on physical  exam. Labs: Lab Results  Component Value Date   WBC 9.6 01/03/2016   HGB 9.2* 01/03/2016   HCT 28.1* 01/03/2016   MCV 85.2 01/03/2016   PLT 173 01/03/2016   CMP Latest Ref Rng 01/01/2016  Glucose 70 - 99 mg/dL -  BUN 6 - 23 mg/dL -  Creatinine 1.30 - 8.65 mg/dL -  Sodium 784 - 696 mEq/L -  Potassium 3.7 - 5.3 mEq/L -  Chloride 96 - 112 mEq/L -  CO2 19 - 32 mEq/L -  Calcium 8.4 - 10.5 mg/dL -  Total Protein 6.5 - 8.1 g/dL 6.6  Total Bilirubin 0.3 - 1.2 mg/dL 0.9  Alkaline Phos 38 - 126 U/L 155(H)  AST 15 - 41 U/L 17  ALT 14 - 54 U/L 14    Discharge instruction: per After Visit Summary and "Baby and Me Booklet".  After visit meds:    Medication List  TAKE these medications        ibuprofen 600 MG tablet  Commonly known as:  ADVIL,MOTRIN  Take 1 tablet (600 mg total) by mouth every 6 (six) hours as needed.     NIFEdipine 60 MG 24 hr tablet  Commonly known as:  PROCARDIA-XL/ADALAT CC  Take 1 tablet (60 mg total) by mouth daily.     prenatal multivitamin Tabs tablet  Take 1 tablet by mouth daily at 12 noon.        Diet: low salt diet  Activity: Advance as tolerated. Pelvic rest for 6 weeks.   Outpatient follow up:1week and 6weeks Follow up Appt:No future appointments. Follow up Visit:No Follow-up on file.  Postpartum contraception: Nexplanon  Newborn Data: Live born female  Birth Weight: 6 lb 14.9 oz (3145 g) APGAR: 8, 9  Baby Feeding: Breast Disposition:home with mother   01/04/2016 Edwinna Areola, DO

## 2016-01-04 NOTE — Discharge Instructions (Signed)
Nothing in vagina for 6 weeks.  No sex, tampons, and douching.  Other instructions as in [facility] Discharge Booklet. °

## 2016-01-04 NOTE — Lactation Note (Signed)
This note was copied from a baby's chart. Lactation Consultation Note RN reported baby having difficulty BF. Stated baby is a poor feeder. Baby appears to have good feedings according to the feeding chart from mom. Mom is BF, then pumping and supplementing w/colostrum. Baby has a severe recessed chin. Bottom lip tucks back into mouth. Supplementing w//formula, I had to frequently pull the bottom lip out. In order to get bottle into mouth, had to pull down bottom lip. Still have to flange lips d/t rolling into mouth a little that it affects suckling, baby had at times chewing motion. Had uncoordinated suck and swallow. At times would suckle on bottle w/good coordination and swallow, but it is for a short time. LC on previous shift gave NS. Needs a lot of mouth adjustments for proper latching.  Mom sleeping. Baby in nursery.  Patient Name: Karina Rivera ZOXWR'U Date: 01/04/2016 Reason for consult: Follow-up assessment   Maternal Data    Feeding Feeding Type: Formula Nipple Type: Slow - flow Length of feed: 30 min  LATCH Score/Interventions                      Lactation Tools Discussed/Used Tools: Bottle   Consult Status Consult Status: Follow-up Date: 01/04/16 Follow-up type: In-patient    Charyl Dancer 01/04/2016, 5:40 AM

## 2016-01-04 NOTE — Progress Notes (Signed)
Patient ID: Darden Dates, female   DOB: February 17, 1986, 30 y.o.   MRN: [id number] Pt doing well. Still no HA or blurry vision. Lochia scant. Breastfeeding well. Pain controlled. Ready for discharge to home today VSS- BP 135/90 ABD- soft, FF EXT- no Homans  A/P: PPD #2 s/p svd        chtn with superimposed preE- bp stable on procardia        discharge to home today-instructions reviewed

## 2016-01-04 NOTE — Lactation Note (Signed)
This note was copied from a baby's chart. Lactation Consultation Note Documented on wrong pt. Patient Name: Karina Rivera ZOXWR'U Date: 01/04/2016 Reason for consult: Follow-up assessment   Maternal Data    Feeding Feeding Type: Formula Nipple Type: Slow - flow Length of feed: 30 min  LATCH Score/Interventions                      Lactation Tools Discussed/Used Tools: Bottle   Consult Status Consult Status: Follow-up Date: 01/04/16 Follow-up type: In-patient    Charyl Dancer 01/04/2016, 6:02 AM   Lactation Consultation Note Charted on wrong pt. Patient Name: Karina Rivera EAVWU'J Date: 01/04/2016 Reason for consult: Follow-up assessment   Maternal Data    Feeding Feeding Type: Formula Nipple Type: Slow - flow Length of feed: 30 min  LATCH Score/Interventions                      Lactation Tools Discussed/Used Tools: Bottle   Consult Status Consult Status: Follow-up Date: 01/04/16 Follow-up type: In-patient    Makala Fetterolf, Diamond Nickel 01/04/2016, 6:05 AM

## 2016-01-04 NOTE — Lactation Note (Signed)
This note was copied from a baby's chart. Lactation Consultation Note RN reported baby having difficulty BF. Stated baby is a poor feeder. Baby appears to have good feedings according to the feeding chart from mom. Mom is BF, then pumping and supplementing w/colostrum. Baby has a severe recessed chin. Bottom lip tucks back into mouth. Supplementing w//formula, I had to frequently pull the bottom lip out. In order to get bottle into mouth, had to pull down bottom lip. Still have to flange lips d/t rolling into mouth a little that it affects suckling, baby had at times chewing motion. Had uncoordinated suck and swallow. At times would suckle on bottle w/good coordination and swallow, but it is for a short time. LC on previous shift gave NS. Needs a lot of mouth adjustments for proper latching.  Mom sleeping. Baby in nursery.  Patient Name: Karina Rivera FIEPP'I Date: 01/04/2016 Reason for consult: Follow-up assessment   Maternal Data    Feeding Feeding Type: Formula Nipple Type: Slow - flow Length of feed: 30 min  LATCH Score/Interventions                      Lactation Tools Discussed/Used Tools: Bottle   Consult Status Consult Status: Follow-up Date: 01/04/16 Follow-up type: In-patient    Karina Rivera, Diamond Nickel 01/04/2016, 6:02 AM

## 2016-11-04 IMAGING — US US MFM OB DETAIL+14 WK
1 series · 14 of 28 positions shown · non-contrast
Comparison: none

[Series 1: us mfm ob detail+14 wk · 114 acquisitions, 14 frames shown]
[im 5/114]
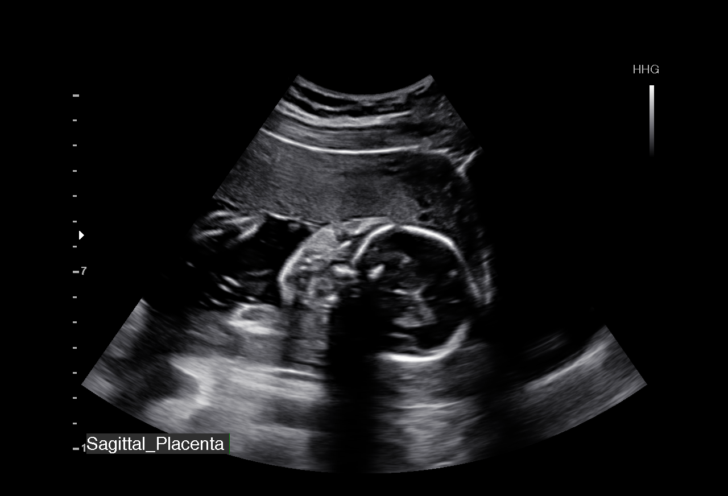
[im 13/114]
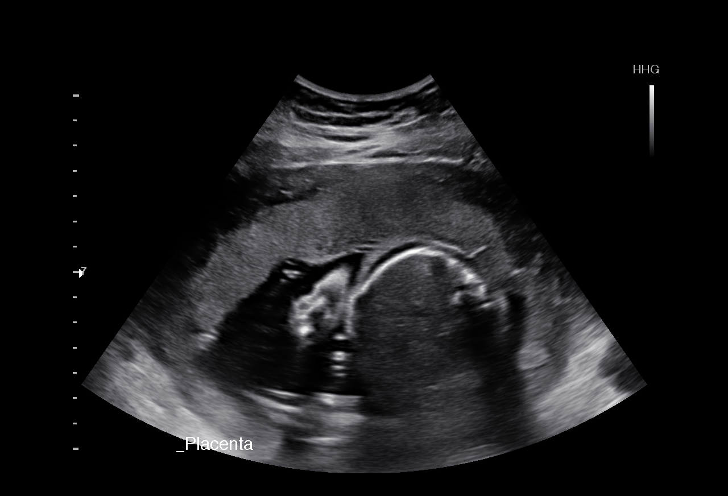
[im 21/114]
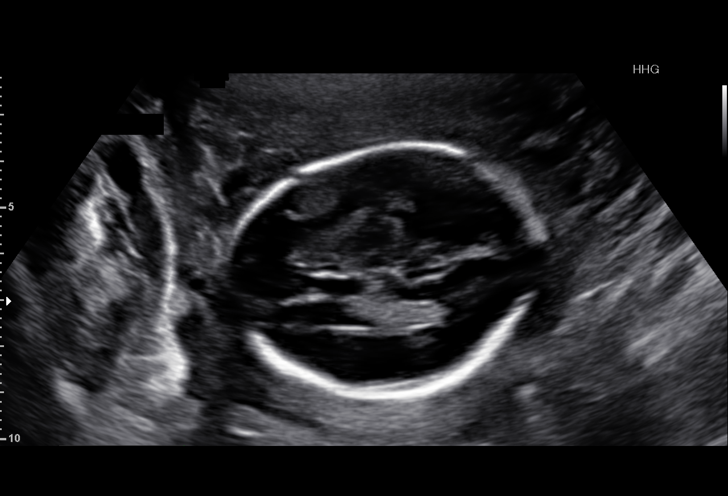
[im 30/114]
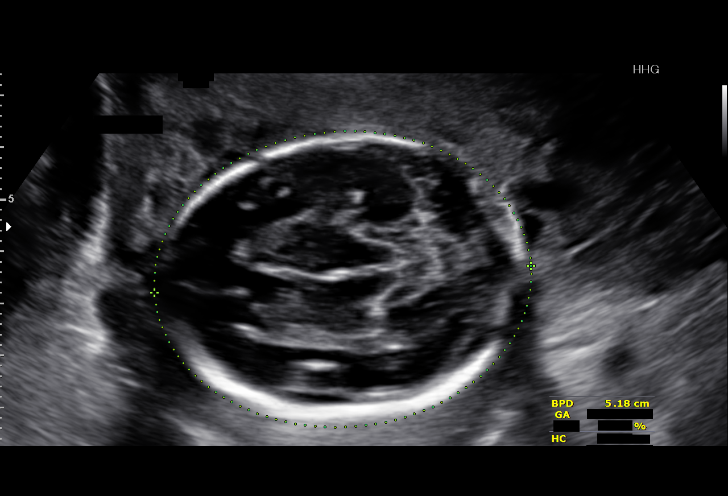
[im 38/114]
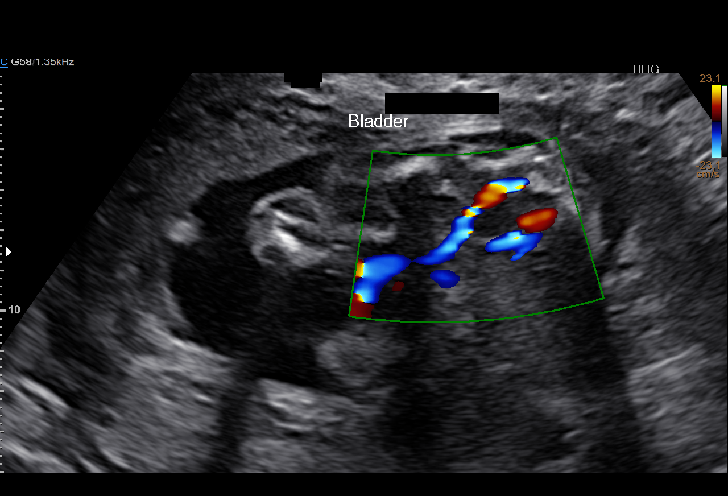
[im 47/114]
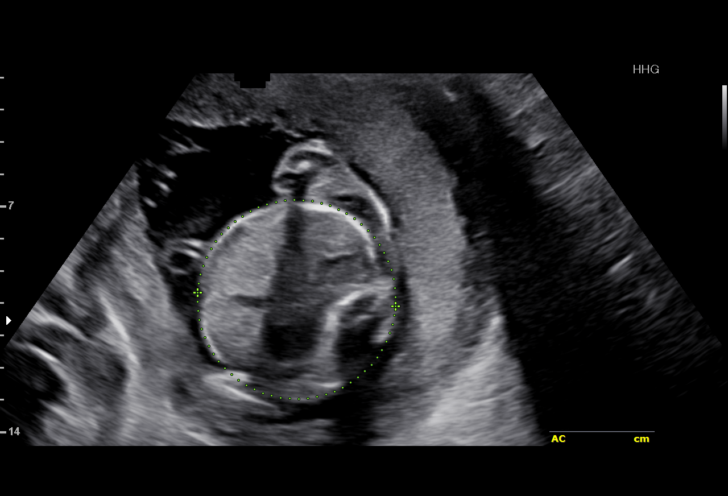
[im 55/114]
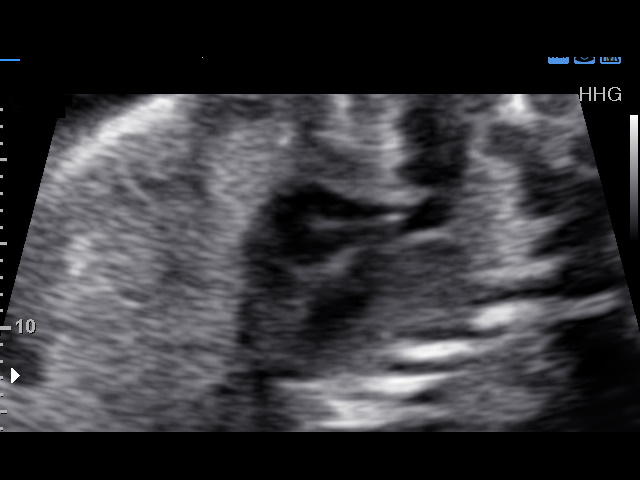
[im 63/114]
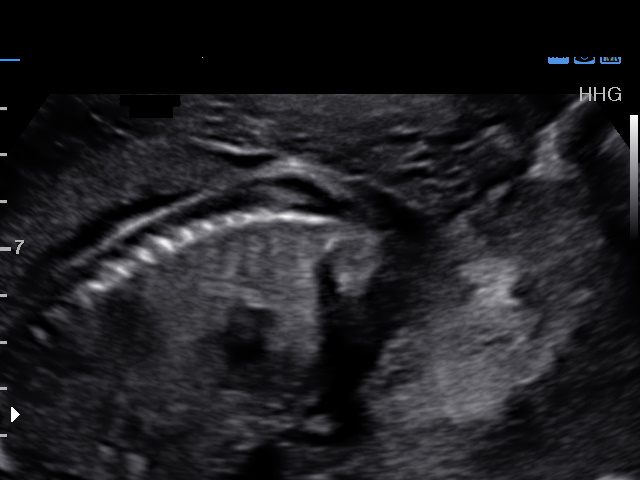
[im 72/114]
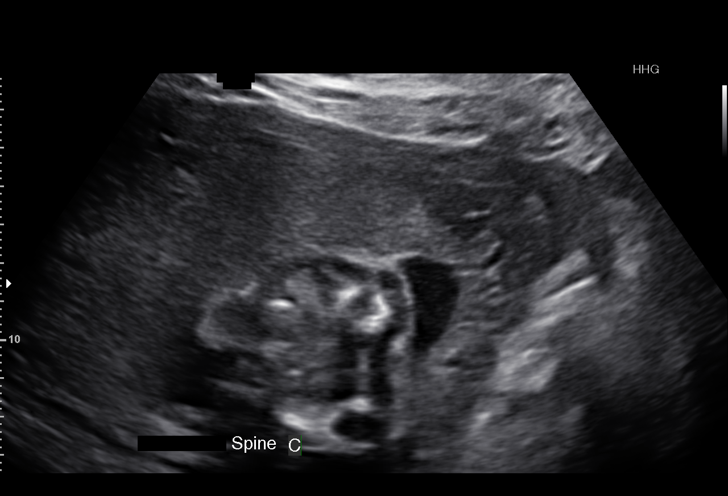
[im 80/114]
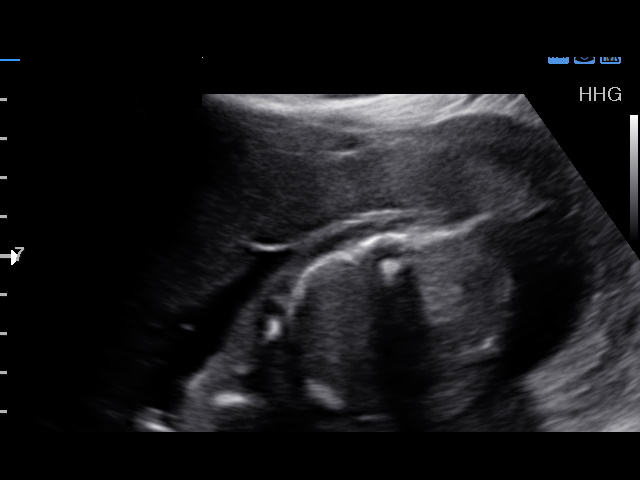
[im 88/114]
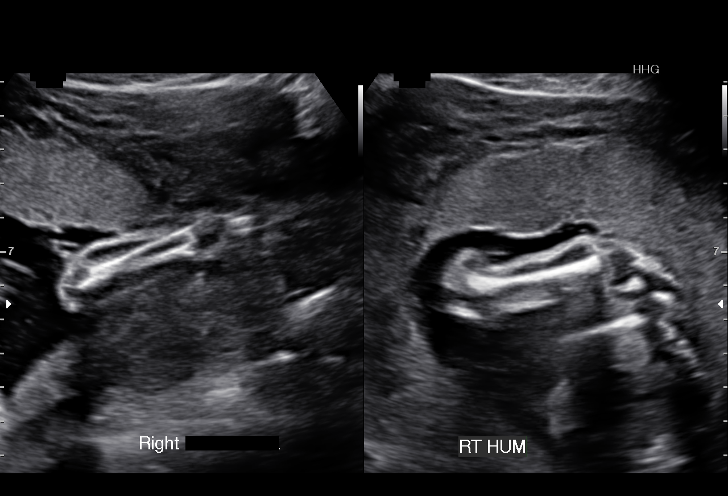
[im 97/114]
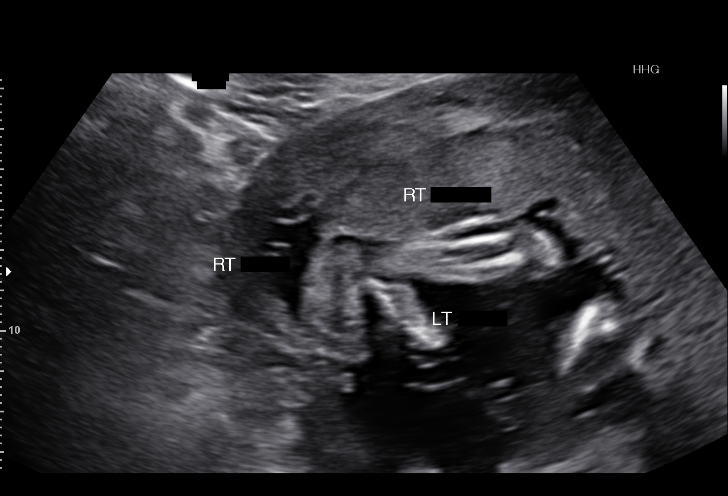
[im 105/114]
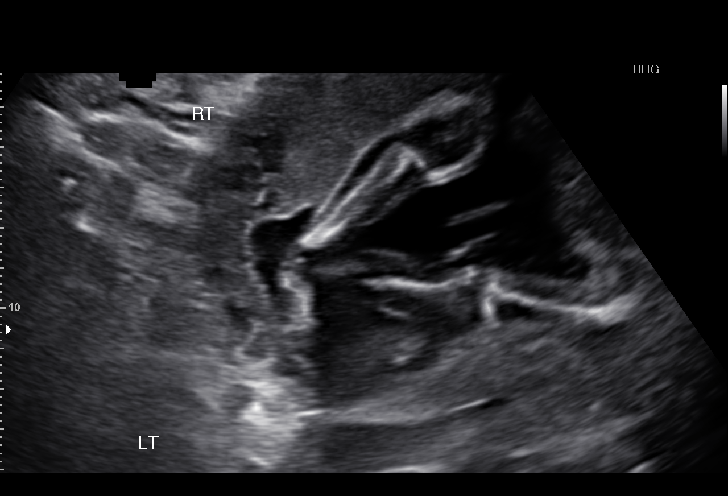
[im 114/114]
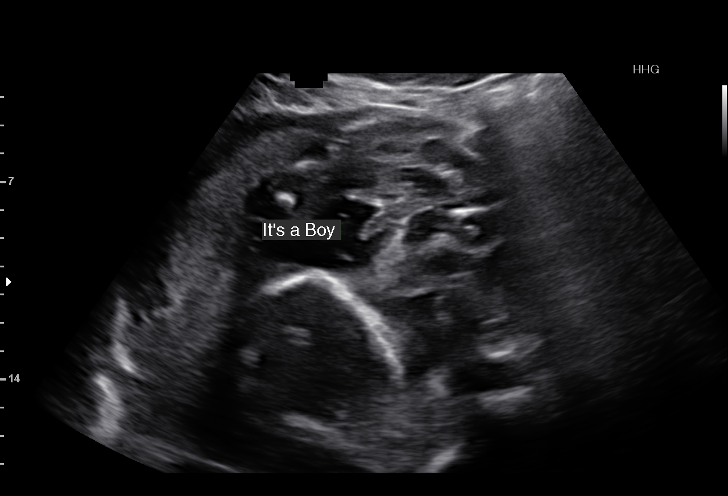

[14 of 28 positions shown; findings below may reference images not displayed]

OBSTETRICS REPORT
(Signed Final 09/21/2015 [DATE])

Name:       FOREST LE                        Visit  09/21/2015 [DATE]
Date:

Service(s) Provided

Indications

22 weeks gestation of pregnancy
Detailed fetal anatomic survey                         Z36
Poor obstetric history (prior pre-term labor 30wks)
Poor obstetric history: Previous preeclampsia /
eclampsia/gestational HTN
Fetal abnormality - other known or suspected
(i.e. choriod plexus cyst, EIF, renal pyelectasis)
Fetal Evaluation

Num Of             1
Fetuses:
Fetal Heart        142                          bpm
Rate:
Cardiac Activity:  Observed
Presentation:      Cephalic
Placenta:          Anterior, above cervical
os
P. Cord            Visualized, central
Insertion:

Amniotic Fluid
AFI FV:      Subjectively within normal limits
Larg Pckt:      4.6  cm
Biometry

BPD:     51.8   m    G. Age:   21w 5d                 CI:        67.84   70 - 86
m
FL/HC:      19.9   19.2 -
20.8
HC:     201.2   m    G. Age:   22w 2d        17  %    HC/AC:      1.02   1.05 -
m
AC:     196.9   m    G. Age:   24w 3d        84  %    FL/BPD      77.4   71 - 87
m                                     :
FL:      40.1   m    G. Age:   23w 0d        42  %    FL/AC:      20.4   20 - 24
m
HUM:       39   m    G. Age:   23w 6d        66  %
m

Est.         600   gm    1 lb 5 oz      62   %
FW:
Gestational Age

LMP:           22w 6d        Date:  04/14/15                  EDD:   01/19/16
U/S Today:     22w 6d                                         EDD:   01/19/16
Best:          22w 6d    Det. By:   LMP  (04/14/15)           EDD:   01/19/16
Anatomy

Cranium:          Appears normal         Aortic Arch:       Appears normal
Fetal Cavum:      Appears normal         Ductal Arch:       Appears normal
Ventricles:       Appears normal         Diaphragm:         Appears normal
Choroid Plexus:   Appears normal         Stomach:           Appears normal,
left sided
Cerebellum:       Appears normal         Abdomen:           Appears normal
Posterior         Appears normal         Abdominal          Appears nml (cord
Fossa:                                   Wall:              insert, abd wall)
Nuchal Fold:      Not applicable (>20    Cord Vessels:      Appears normal (3
wks GA)                                   vessel cord)
Face:             Orbits appear          Kidneys:           Appear normal
normal
Lips:             Not well visualized    Bladder:           Appears normal
Heart:            Appears normal         Spine:             Ltd views no
(4CH, axis, and                           intracranial signs of
situs)                                    NTD
RVOT:             Appears normal         Lower              Abnormal, see
Extremities:       comments
LVOT:             Appears normal         Upper              Appears normal
Extremities:

Other:   Fetus appears to be a male. Nasal bone visualized. Technically
difficult due to fetal position.
Targeted Anatomy

Fetal Central Nervous System
Lat. Ventricles:  5.0                    Cisterna
Magna:
Cervix Uterus Adnexa

Cervical Length:    4.26      cm

Cervix:       Normal appearance by transabdominal scan.
Uterus:       No abnormality visualized.

Left Ovary:    Not visualized.
Right Ovary:   Within normal limits.
Impression

Single IUP at 22w 6d
An isolated right club foot is appreciated; the left foot
appears normal
Limited views of the face (lips/ profile) and spine were
obtained.
The remainder of the anatomy appears normal
Fetal growth is appropriate (62nd %tile)
Anterior placenta without previa
Normal amniotic fluid volume
Recommendations

Recommend follow-up ultrasound examination in 6 weeks
for growth and to reevaluate
See separate note from Genetics counselor - declined
further genetic testing

## 2017-01-15 ENCOUNTER — Ambulatory Visit: Payer: Self-pay

## 2018-08-25 ENCOUNTER — Ambulatory Visit (HOSPITAL_COMMUNITY)
Admission: EM | Admit: 2018-08-25 | Discharge: 2018-08-25 | Disposition: A | Discharge status: Home / Self Care | Payer: Medicaid Other | Attending: Family Medicine | Admitting: Family Medicine

## 2018-08-25 ENCOUNTER — Encounter (HOSPITAL_COMMUNITY): Payer: Self-pay | Admitting: Emergency Medicine

## 2018-08-25 DIAGNOSIS — L02412 Cutaneous abscess of left axilla: Secondary | ICD-10-CM | POA: Diagnosis not present

## 2018-08-25 DIAGNOSIS — R030 Elevated blood-pressure reading, without diagnosis of hypertension: Secondary | ICD-10-CM

## 2018-08-25 DIAGNOSIS — I10 Essential (primary) hypertension: Secondary | ICD-10-CM | POA: Diagnosis not present

## 2018-08-25 MED ORDER — NIFEDIPINE ER 60 MG PO TB24: 60.0000 mg | ORAL_TABLET | Freq: Every day | ORAL | 1 refills | Status: AC

## 2018-08-25 MED ORDER — DOXYCYCLINE HYCLATE 100 MG PO CAPS: 100.0000 mg | ORAL_CAPSULE | Freq: Two times a day (BID) | ORAL | 0 refills | Status: DC

## 2018-08-25 MED ORDER — TRAMADOL HCL 50 MG PO TABS: 50.0000 mg | ORAL_TABLET | Freq: Two times a day (BID) | ORAL | 0 refills | Status: DC | PRN

## 2018-08-25 NOTE — ED Triage Notes (Signed)
PT has recurrent abscesses under both arms. PT has seen [facility] several times, but areas keep returning.

## 2018-08-25 NOTE — Discharge Instructions (Addendum)
Incision and drainage performed Bandage applied Keep dry and covered for the next 48 hours Return to have packing removed in 48 hours or you may remove at home.   After packing is removed you may apply warm compresses 3-4x daily for 10-15 minutes.  You may then wash site daily with warm water and mild soap Keep covered to avoid friction Doxycycline prescribed.  Take antibiotic as prescribed and to completion. DO NOT TAKE IF BREAST FEEDING Continue with OTC tylenol or ibuprofen as needed for pain.  Use tramadol as needed for severe break-through pain. Return or go to the ED if you have any new or worsening symptoms such as increased pain, swelling, redness, fever, chills, nausea, vomiting, etc...  Blood pressure elevated in office.  Blood pressure medication refilled. Recheck in 24 hours.  If it continues to be greater than 140/90 please follow up with PCP for further evaluation and management.

## 2018-08-25 NOTE — ED Provider Notes (Signed)
[facility]   [id number] 08/25/18 Arrival Time: 0818   [id number]  SUBJECTIVE:  Karina Rivera is a 32 y.o. female who presents with a possible abscess of her left axilla. Onset abrupt, approximately 2 days ago.  Worse with friction. Has tried warm compresses without relief.  Has been seen at [facility] for similar symptoms in the past and was treated with cream and antibiotic.  Complains of associated chills.  Denies fever, body aches, nausea, or vomiting.    ROS: As per HPI.  Past Medical History:  Diagnosis Date  . Hypertension    Past Surgical History:  Procedure Laterality Date  . NO PAST SURGERIES     No Known Allergies Current Facility-Administered Medications on File Prior to Encounter  Medication Dose Route Frequency Provider Last Rate Last Dose  . aspirin chewable tablet 324 mg  324 mg Oral Once Copland, Gwenlyn Found, MD       Current Outpatient Medications on File Prior to Encounter  Medication Sig Dispense Refill  . ibuprofen (ADVIL,MOTRIN) 600 MG tablet Take 1 tablet (600 mg total) by mouth every 6 (six) hours as needed. 40 tablet 1  . Prenatal Vit-Fe Fumarate-FA (PRENATAL MULTIVITAMIN) TABS tablet Take 1 tablet by mouth daily at 12 noon.     Social History   Socioeconomic History  . Marital status: Married    Spouse name: Not on file  . Number of children: Not on file  . Years of education: Not on file  . Highest education level: Not on file  Occupational History  . Not on file  Social Needs  . Financial resource strain: Not on file  . Food insecurity:    Worry: Not on file    Inability: Not on file  . Transportation needs:    Medical: Not on file    Non-medical: Not on file  Tobacco Use  . Smoking status: Former Games developer  . Smokeless tobacco: Never Used  Substance and Sexual Activity  . Alcohol use: No  . Drug use: No  . Sexual activity: Yes    Birth control/protection: Implant  Lifestyle  . Physical activity:    Days per week: Not on  file    Minutes per session: Not on file  . Stress: Not on file  Relationships  . Social connections:    Talks on phone: Not on file    Gets together: Not on file    Attends religious service: Not on file    Active member of club or organization: Not on file    Attends meetings of clubs or organizations: Not on file    Relationship status: Not on file  . Intimate partner violence:    Fear of current or ex partner: Not on file    Emotionally abused: Not on file    Physically abused: Not on file    Forced sexual activity: Not on file  Other Topics Concern  . Not on file  Social History Narrative  . Not on file   Family History  Problem Relation Age of Onset  . Diabetes Father   . Asthma Daughter   . Heart disease Maternal Grandmother     OBJECTIVE:  Vitals:   08/25/18 0842 08/25/18 0943  BP: (!) 172/113 (!) 204/140  Pulse: 89 76  Resp: 16 16  Temp: 99.3 F (37.4 C)   TempSrc: Oral   SpO2: 96% 97%     General appearance: alert; no distress Skin: 6x3 cm area of induration of her left  axilla; tender to touch; no active drainage Psychological: alert and cooperative; normal mood and affect  Procedure: Verbal consent obtained. Area over induration cleaned with betadine. Lidocaine 2% without epinephrine used to obtain local anesthesia. The most fluctuant portion of the abscess was incised with a #11 blade scalpel. Abscess cavity explored and evacuated. Loculations broken up with a curved hemostat as best as possible given patient discomfort. Cavity packed with packing material and dressed with a clean gauze dressing. Minimal bleeding. No complications.  ASSESSMENT & PLAN:  1. Abscess of left axilla   2. Elevated blood pressure reading     Meds ordered this encounter  Medications  . doxycycline (VIBRAMYCIN) 100 MG capsule    Sig: Take 1 capsule (100 mg total) by mouth 2 (two) times daily.    Dispense:  20 capsule    Refill:  0    Order Specific Question:    Supervising Provider    Answer:   Isa Rankin [phone]  . traMADol (ULTRAM) 50 MG tablet    Sig: Take 1 tablet (50 mg total) by mouth every 12 (twelve) hours as needed for severe pain.    Dispense:  10 tablet    Refill:  0    Order Specific Question:   Supervising Provider    Answer:   Isa Rankin [phone]  . NIFEdipine (ADALAT CC) 60 MG 24 hr tablet    Sig: Take 1 tablet (60 mg total) by mouth daily.    Dispense:  30 tablet    Refill:  1    Order Specific Question:   Supervising Provider    Answer:   Isa Rankin [phone]   Incision and drainage performed Bandage applied Keep dry and covered for the next 48 hours Return to have packing removed in 48 hours or you may remove at home.   After packing is removed you may apply warm compresses 3-4x daily for 10-15 minutes.  You may then wash site daily with warm water and mild soap Keep covered to avoid friction Doxycycline prescribed.  Take antibiotic as prescribed and to completion. DO NOT TAKE IF BREAST FEEDING Continue with OTC tylenol or ibuprofen as needed for pain.  Use tramadol as needed for severe break-through pain. Return or go to the ED if you have any new or worsening symptoms such as increased pain, swelling, redness, fever, chills, nausea, vomiting, etc...  Blood pressure elevated in office.  Blood pressure medication refilled. Recheck in 24 hours.  If it continues to be greater than 140/90 please follow up with PCP for further evaluation and management.    Reviewed expectations re: course of current medical issues. Questions answered. Outlined signs and symptoms indicating need for more acute intervention. Patient verbalized understanding. After Visit Summary given.          Rennis Harding, PA-C 08/25/18 1023

## 2018-12-14 ENCOUNTER — Ambulatory Visit
Admission: EM | Admit: 2018-12-14 | Discharge: 2018-12-14 | Disposition: A | Discharge status: Home / Self Care | Payer: 59 | Attending: Family Medicine | Admitting: Family Medicine

## 2018-12-14 DIAGNOSIS — J3489 Other specified disorders of nose and nasal sinuses: Secondary | ICD-10-CM

## 2018-12-14 MED ORDER — IBUPROFEN 800 MG PO TABS: 800.0000 mg | ORAL_TABLET | Freq: Three times a day (TID) | ORAL | 0 refills | Status: AC

## 2018-12-14 MED ORDER — SULFAMETHOXAZOLE-TRIMETHOPRIM 800-160 MG PO TABS: 1.0000 | ORAL_TABLET | Freq: Two times a day (BID) | ORAL | 0 refills | Status: AC

## 2018-12-14 NOTE — ED Triage Notes (Signed)
Pt c/o nasal congestion and nose pain since Saturday

## 2018-12-14 NOTE — ED Provider Notes (Signed)
[facility]    CSN: [id number] Arrival date & time: 12/14/18  1156     History   Chief Complaint Chief Complaint  Patient presents with  . Nasal Congestion    HPI Darden DatesChenay A Sandoval is a 33 y.o. female.   HPI  Nasal congestion and nose pain for two days, getting worse today.  Can hardy breathe through  Nose.  No rhinorhhea.  No PND.  Nose is painful and tender to touch,  Feels swollen. No cough.  No fever.  Past Medical History:  Diagnosis Date  . Hypertension     Patient Active Problem List   Diagnosis Date Noted  . Postpartum care following vaginal delivery 01/02/2016  . Preeclampsia 01/01/2016  . 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 [redacted] weeks gestation of pregnancy     Past Surgical History:  Procedure Laterality Date  . NO PAST SURGERIES      OB History    Gravida  2   Para  2   Term  1   Preterm  1   AB      Living  2     SAB      TAB      Ectopic      Multiple  0   Live Births  2            Home Medications    Prior to Admission medications   Medication Sig Start Date End Date Taking? Authorizing Provider  ibuprofen (ADVIL,MOTRIN) 800 MG tablet Take 1 tablet (800 mg total) by mouth 3 (three) times daily. 12/14/18   Eustace MooreNelson, Henrietta Cieslewicz Sue, MD  NIFEdipine (ADALAT CC) 60 MG 24 hr tablet Take 1 tablet (60 mg total) by mouth daily. 08/25/18   Wurst, GrenadaBrittany, PA-C  sulfamethoxazole-trimethoprim (BACTRIM DS,SEPTRA DS) 800-160 MG tablet Take 1 tablet by mouth 2 (two) times daily for 7 days. 12/14/18 12/21/18  Eustace MooreNelson, Sejla Marzano Sue, MD    Family History Family History  Problem Relation Age of Onset  . Diabetes Father   . Asthma Daughter   . Heart disease Maternal Grandmother     Social History Social History   Tobacco Use  . Smoking status: Former Games developermoker  . Smokeless tobacco: Never Used  Substance Use Topics  . Alcohol use: No  . Drug use: No     Allergies   Patient has no known  allergies.   Review of Systems Review of Systems  Constitutional: Negative for chills and fever.  HENT: Positive for congestion. Negative for ear pain and sore throat.        Nose hurts   Eyes: Negative for pain and visual disturbance.  Respiratory: Negative for cough and shortness of breath.   Cardiovascular: Negative for chest pain and palpitations.  Gastrointestinal: Negative for abdominal pain and vomiting.  Genitourinary: Negative for dysuria and hematuria.  Musculoskeletal: Negative for arthralgias and back pain.  Skin: Negative for color change and rash.  Neurological: Negative for seizures and syncope.  All other systems reviewed and are negative.    Physical Exam Triage Vital Signs ED Triage Vitals [12/14/18 1233]  Enc Vitals Group     BP (!) 157/102     Pulse Rate 72     Resp 18     Temp 98.9 F (37.2 C)     Temp Source Oral     SpO2 98 %     Weight      Height  Head Circumference      Peak Flow      Pain Score 3     Pain Loc      Pain Edu?      Excl. in GC?    No data found.  Updated Vital Signs BP (!) 157/102 (BP Location: Left Arm)   Pulse 72   Temp 98.9 F (37.2 C) (Oral)   Resp 18   LMP 12/12/2018   SpO2 98%   Visual Acuity Right Eye Distance:   Left Eye Distance:   Bilateral Distance:    Right Eye Near:   Left Eye Near:    Bilateral Near:     Physical Exam Constitutional:      General: She is not in acute distress.    Appearance: She is well-developed. She is not ill-appearing.  HENT:     Head: Normocephalic and atraumatic.     Right Ear: Tympanic membrane, ear canal and external ear normal.     Left Ear: Tympanic membrane, ear canal and external ear normal.     Nose: Congestion present.     Comments: Nasal membranes red and swollen    Mouth/Throat:     Mouth: Mucous membranes are moist.     Pharynx: No posterior oropharyngeal erythema.  Eyes:     Conjunctiva/sclera: Conjunctivae normal.     Pupils: Pupils are equal,  round, and reactive to light.  Neck:     Musculoskeletal: Normal range of motion.  Cardiovascular:     Rate and Rhythm: Normal rate.  Pulmonary:     Effort: Pulmonary effort is normal. No respiratory distress.  Abdominal:     General: There is no distension.     Palpations: Abdomen is soft.  Musculoskeletal: Normal range of motion.  Skin:    General: Skin is warm and dry.  Neurological:     Mental Status: She is alert.  Psychiatric:        Mood and Affect: Mood normal.        Thought Content: Thought content normal.      UC Treatments / Results  Labs (all labs ordered are listed, but only abnormal results are displayed) Labs Reviewed - No data to display  EKG None  Radiology No results found.  Procedures Procedures (including critical care time)  Medications Ordered in UC Medications - No data to display  Initial Impression / Assessment and Plan / UC Course  I have reviewed the triage vital signs and the nursing notes.  Pertinent labs & imaging results that were available during my care of the patient were reviewed by me and considered in my medical decision making (see chart for details).     I have only seen nose pain with cellulitis, which is quite painful.  Will treat with antibiotic to cover. Final Clinical Impressions(s) / UC Diagnoses   Final diagnoses:  Pain of nose     Discharge Instructions     You may use a saltwater spray in your nose if it feels dry Take the antibiotic 2 times a day.  Take 2 doses today Take ibuprofen 3 times a day with food Return if worse instead of better   ED Prescriptions    Medication Sig Dispense Auth. Provider   sulfamethoxazole-trimethoprim (BACTRIM DS,SEPTRA DS) 800-160 MG tablet Take 1 tablet by mouth 2 (two) times daily for 7 days. 14 tablet Eustace MooreNelson, Mari Battaglia Sue, MD   ibuprofen (ADVIL,MOTRIN) 800 MG tablet Take 1 tablet (800 mg total) by mouth 3 (three) times  daily. 21 tablet Eustace Moore, MD      Controlled Substance Prescriptions [place] Controlled Substance Registry consulted? Not Applicable   Eustace Moore, MD 12/14/18 1323

## 2018-12-14 NOTE — Discharge Instructions (Addendum)
You may use a saltwater spray in your nose if it feels dry Take the antibiotic 2 times a day.  Take 2 doses today Take ibuprofen 3 times a day with food Return if worse instead of better

## 2018-12-21 ENCOUNTER — Ambulatory Visit
Admission: EM | Admit: 2018-12-21 | Discharge: 2018-12-21 | Disposition: A | Discharge status: Home / Self Care | Payer: 59 | Attending: Family Medicine | Admitting: Family Medicine

## 2018-12-21 ENCOUNTER — Encounter: Payer: Self-pay | Admitting: Emergency Medicine

## 2018-12-21 DIAGNOSIS — T7840XA Allergy, unspecified, initial encounter: Secondary | ICD-10-CM | POA: Diagnosis not present

## 2018-12-21 MED ORDER — PREDNISONE 50 MG PO TABS: ORAL_TABLET | ORAL | 0 refills | Status: DC

## 2018-12-21 MED ORDER — TRIAMCINOLONE ACETONIDE 0.1 % EX CREA: 1.0000 "application " | TOPICAL_CREAM | Freq: Two times a day (BID) | CUTANEOUS | 0 refills | Status: DC

## 2018-12-21 NOTE — ED Triage Notes (Signed)
Pt presents to [facility] for assessment of rash after starting a new antibiotic last week on Monday, rash began Tuesday, itchy.

## 2018-12-21 NOTE — Discharge Instructions (Addendum)
Prednisone daily for 3-5 days May supplement with anti-histamines- zyrtec/Claritin in the morning, benadryl at night time Follow up if symptoms not resolving or worsening

## 2018-12-21 NOTE — ED Provider Notes (Signed)
[facility]    CSN: [id number] Arrival date & time: 12/21/18  1141     History   Chief Complaint Chief Complaint  Patient presents with  . Rash    HPI Karina Rivera is a 33 y.o. female history of hypertension presenting today for evaluation of rash.  Patient states that she began Bactrim last week to treat a cellulitis of the nose, a few days after she started developed the itchy rash, she has noticed dry spots that various aspects across her body.  She denies any difficulty swallowing, breathing, denies any oral swelling.  She is tried moisturizing these areas to help with itching.  She has not taken anything else for symptoms.  She had never taken Bactrim before.  HPI  Past Medical History:  Diagnosis Date  . Hypertension     Patient Active Problem List   Diagnosis Date Noted  . Postpartum care following vaginal delivery 01/02/2016  . Preeclampsia 01/01/2016  . 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 [redacted] weeks gestation of pregnancy     Past Surgical History:  Procedure Laterality Date  . NO PAST SURGERIES      OB History    Gravida  2   Para  2   Term  1   Preterm  1   AB      Living  2     SAB      TAB      Ectopic      Multiple  0   Live Births  2            Home Medications    Prior to Admission medications   Medication Sig Start Date End Date Taking? Authorizing Provider  NIFEdipine (ADALAT CC) 60 MG 24 hr tablet Take 1 tablet (60 mg total) by mouth daily. 08/25/18  Yes Wurst, [place], PA-C  sulfamethoxazole-trimethoprim (BACTRIM DS,SEPTRA DS) 800-160 MG tablet Take 1 tablet by mouth 2 (two) times daily for 7 days. 12/14/18 12/21/18 Yes Eustace Moore, MD  ibuprofen (ADVIL,MOTRIN) 800 MG tablet Take 1 tablet (800 mg total) by mouth 3 (three) times daily. 12/14/18   Eustace Moore, MD  predniSONE (DELTASONE) 50 MG tablet Take daily for 5 days, with food and in the morning 12/21/18    ,  C, PA-C  triamcinolone cream (KENALOG) 0.1 % Apply 1 application topically 2 (two) times daily. 12/21/18   , Junius Creamer, PA-C    Family History Family History  Problem Relation Age of Onset  . Diabetes Father   . Asthma Daughter   . Heart disease Maternal Grandmother     Social History Social History   Tobacco Use  . Smoking status: Former Games developer  . Smokeless tobacco: Never Used  Substance Use Topics  . Alcohol use: No  . Drug use: No     Allergies   Bactrim [sulfamethoxazole-trimethoprim]   Review of Systems Review of Systems  Constitutional: Negative for fatigue and fever.  HENT: Negative for mouth sores.   Eyes: Negative for visual disturbance.  Respiratory: Negative for shortness of breath.   Cardiovascular: Negative for chest pain.  Gastrointestinal: Negative for abdominal pain, nausea and vomiting.  Genitourinary: Negative for genital sores.  Musculoskeletal: Negative for arthralgias and joint swelling.  Skin: Positive for color change and rash. Negative for wound.  Neurological: Negative for dizziness, weakness, light-headedness and headaches.     Physical Exam Triage Vital Signs ED Triage Vitals  Enc Vitals Group  BP 12/21/18 1227 (!) 150/96     Pulse Rate 12/21/18 1227 92     Resp 12/21/18 1227 18     Temp 12/21/18 1227 98.3 F (36.8 C)     Temp Source 12/21/18 1227 Oral     SpO2 12/21/18 1227 98 %     Weight --      Height --      Head Circumference --      Peak Flow --      Pain Score 12/21/18 1228 0     Pain Loc --      Pain Edu? --      Excl. in GC? --    No data found.  Updated Vital Signs BP (!) 150/96 (BP Location: Left Arm)   Pulse 92   Temp 98.3 F (36.8 C) (Oral)   Resp 18   LMP 12/12/2018   SpO2 98%   Visual Acuity Right Eye Distance:   Left Eye Distance:   Bilateral Distance:    Right Eye Near:   Left Eye Near:    Bilateral Near:     Physical Exam Vitals signs and nursing note reviewed.    Constitutional:      General: She is not in acute distress.    Appearance: She is well-developed.  HENT:     Head: Normocephalic and atraumatic.     Mouth/Throat:     Comments: Oral mucosa pink and moist, no tonsillar enlargement or exudate. Posterior pharynx patent and nonerythematous, no uvula deviation or swelling. Normal phonation. Eyes:     Conjunctiva/sclera: Conjunctivae normal.  Neck:     Musculoskeletal: Neck supple.  Cardiovascular:     Rate and Rhythm: Normal rate and regular rhythm.     Heart sounds: No murmur.  Pulmonary:     Effort: Pulmonary effort is normal. No respiratory distress.     Breath sounds: Normal breath sounds.     Comments: Breathing comfortably at rest, CTABL, no wheezing, rales or other adventitious sounds auscultated Abdominal:     Palpations: Abdomen is soft.     Tenderness: There is no abdominal tenderness.  Skin:    General: Skin is warm and dry.     Comments: Various areas on trunk, neck and back, upper extremities with mild erythema, no obvious hives or urticaria  Neurological:     Mental Status: She is alert.      UC Treatments / Results  Labs (all labs ordered are listed, but only abnormal results are displayed) Labs Reviewed - No data to display  EKG None  Radiology No results found.  Procedures Procedures (including critical care time)  Medications Ordered in UC Medications - No data to display  Initial Impression / Assessment and Plan / UC Course  I have reviewed the triage vital signs and the nursing notes.  Pertinent labs & imaging results that were available during my care of the patient were reviewed by me and considered in my medical decision making (see chart for details).     Patient likely with allergic reaction rash secondary to Bactrim use.  Will treat as such with prednisone, antihistamines.  Stop Bactrim.  Symptoms from previous infection resolved.  Triamcinolone as needed for areas with significant itching.   Does not appear to have any airway involvement at this time.Discussed strict return precautions. Patient verbalized understanding and is agreeable with plan.  Final Clinical Impressions(s) / UC Diagnoses   Final diagnoses:  Allergic reaction, initial encounter     Discharge Instructions  Prednisone daily for 3-5 days May supplement with anti-histamines- zyrtec/Claritin in the morning, benadryl at night time Follow up if symptoms not resolving or worsening   ED Prescriptions    Medication Sig Dispense Auth. Provider   predniSONE (DELTASONE) 50 MG tablet Take daily for 5 days, with food and in the morning 5 tablet ,  C, PA-C   triamcinolone cream (KENALOG) 0.1 % Apply 1 application topically 2 (two) times daily. 30 g , [place] C, PA-C     Controlled Substance Prescriptions [place] Controlled Substance Registry consulted? Not Applicable   Lew Dawes,  C, [place]-C 12/21/18 1407

## 2018-12-21 NOTE — ED Notes (Signed)
Patient able to ambulate independently  

## 2021-10-30 DIAGNOSIS — C50912 Malignant neoplasm of unspecified site of left female breast: Secondary | ICD-10-CM | POA: Diagnosis not present

## 2021-11-01 DIAGNOSIS — Z170 Estrogen receptor positive status [ER+]: Secondary | ICD-10-CM | POA: Diagnosis not present

## 2021-11-01 DIAGNOSIS — C50412 Malignant neoplasm of upper-outer quadrant of left female breast: Secondary | ICD-10-CM | POA: Diagnosis not present

## 2021-11-01 DIAGNOSIS — R87619 Unspecified abnormal cytological findings in specimens from cervix uteri: Secondary | ICD-10-CM | POA: Diagnosis not present

## 2021-11-01 DIAGNOSIS — I10 Essential (primary) hypertension: Secondary | ICD-10-CM | POA: Diagnosis not present

## 2021-11-01 DIAGNOSIS — E669 Obesity, unspecified: Secondary | ICD-10-CM | POA: Diagnosis not present

## 2021-11-01 DIAGNOSIS — C50912 Malignant neoplasm of unspecified site of left female breast: Secondary | ICD-10-CM | POA: Diagnosis not present

## 2021-11-09 DIAGNOSIS — E669 Obesity, unspecified: Secondary | ICD-10-CM | POA: Diagnosis not present

## 2021-11-09 DIAGNOSIS — I10 Essential (primary) hypertension: Secondary | ICD-10-CM | POA: Diagnosis not present

## 2021-11-09 DIAGNOSIS — Z170 Estrogen receptor positive status [ER+]: Secondary | ICD-10-CM | POA: Diagnosis not present

## 2021-11-09 DIAGNOSIS — C50412 Malignant neoplasm of upper-outer quadrant of left female breast: Secondary | ICD-10-CM | POA: Diagnosis not present

## 2021-11-14 DIAGNOSIS — R590 Localized enlarged lymph nodes: Secondary | ICD-10-CM | POA: Diagnosis not present

## 2021-11-14 DIAGNOSIS — C50412 Malignant neoplasm of upper-outer quadrant of left female breast: Secondary | ICD-10-CM | POA: Diagnosis not present

## 2021-11-14 DIAGNOSIS — R922 Inconclusive mammogram: Secondary | ICD-10-CM | POA: Diagnosis not present

## 2021-12-05 DIAGNOSIS — C50112 Malignant neoplasm of central portion of left female breast: Secondary | ICD-10-CM | POA: Diagnosis not present

## 2021-12-07 DIAGNOSIS — Z170 Estrogen receptor positive status [ER+]: Secondary | ICD-10-CM | POA: Diagnosis not present

## 2021-12-07 DIAGNOSIS — C50412 Malignant neoplasm of upper-outer quadrant of left female breast: Secondary | ICD-10-CM | POA: Diagnosis not present

## 2022-11-21 DIAGNOSIS — D0512 Intraductal carcinoma in situ of left breast: Secondary | ICD-10-CM | POA: Diagnosis not present

## 2022-11-21 DIAGNOSIS — I10 Essential (primary) hypertension: Secondary | ICD-10-CM | POA: Diagnosis not present

## 2022-11-21 DIAGNOSIS — D649 Anemia, unspecified: Secondary | ICD-10-CM | POA: Diagnosis not present

## 2022-11-21 DIAGNOSIS — Z170 Estrogen receptor positive status [ER+]: Secondary | ICD-10-CM | POA: Diagnosis not present

## 2022-12-20 DIAGNOSIS — C50412 Malignant neoplasm of upper-outer quadrant of left female breast: Secondary | ICD-10-CM | POA: Diagnosis not present

## 2022-12-20 DIAGNOSIS — Z9012 Acquired absence of left breast and nipple: Secondary | ICD-10-CM | POA: Diagnosis not present

## 2022-12-20 DIAGNOSIS — Z170 Estrogen receptor positive status [ER+]: Secondary | ICD-10-CM | POA: Diagnosis not present

## 2022-12-20 DIAGNOSIS — I10 Essential (primary) hypertension: Secondary | ICD-10-CM | POA: Diagnosis not present

## 2022-12-25 DIAGNOSIS — C50912 Malignant neoplasm of unspecified site of left female breast: Secondary | ICD-10-CM | POA: Diagnosis not present

## 2023-03-24 ENCOUNTER — Encounter (HOSPITAL_COMMUNITY): Payer: Self-pay | Admitting: Emergency Medicine

## 2023-03-24 ENCOUNTER — Ambulatory Visit (HOSPITAL_COMMUNITY)
Admission: EM | Admit: 2023-03-24 | Discharge: 2023-03-24 | Disposition: A | Discharge status: Home / Self Care | Payer: BC Managed Care – PPO | Attending: Family Medicine | Admitting: Family Medicine

## 2023-03-24 DIAGNOSIS — J020 Streptococcal pharyngitis: Secondary | ICD-10-CM

## 2023-03-24 HISTORY — DX: Malignant (primary) neoplasm, unspecified: C80.1

## 2023-03-24 LAB — POCT RAPID STREP A (OFFICE): Rapid Strep A Screen: POSITIVE — AB

## 2023-03-24 MED ORDER — AMOXICILLIN 875 MG PO TABS: 875.0000 mg | ORAL_TABLET | Freq: Two times a day (BID) | ORAL | 0 refills | Status: AC

## 2023-03-24 NOTE — Discharge Instructions (Signed)
You may use over the counter ibuprofen or acetaminophen as needed.  °For a sore throat, over the counter products such as [employer] Peroxyl Mouth Sore Rinse or Chloraseptic Sore Throat Spray may provide some temporary relief. ° ° ° ° °

## 2023-03-24 NOTE — ED Triage Notes (Signed)
Pt c/o sore throat for few days. Hasn't taken any medication for sympotms

## 2023-03-24 NOTE — ED Provider Notes (Signed)
  [facility]   [id number] 03/24/23 Arrival Time: 0935  ASSESSMENT & PLAN:  1. Strep throat    No signs of peritonsillar abscess. Discussed.  Meds ordered this encounter  Medications   amoxicillin (AMOXIL) 875 MG tablet    Sig: Take 1 tablet (875 mg total) by mouth 2 (two) times daily for 10 days.    Dispense:  20 tablet    Refill:  0    Results for orders placed or performed during the hospital encounter of 03/24/23  POC rapid strep A  Result Value Ref Range   Rapid Strep A Screen Positive (A) Negative    OTC analgesics and throat care as needed Work note provided.  Instructed to finish full 10 day course of antibiotics. Will follow up if not showing significant improvement over the next 24-48 hours.    Discharge Instructions      You may use over the counter ibuprofen or acetaminophen as needed.  For a sore throat, over the counter products such as [employer] Peroxyl Mouth Sore Rinse or Chloraseptic Sore Throat Spray may provide some temporary relief.      Reviewed expectations re: course of current medical issues. Questions answered. Outlined signs and symptoms indicating need for more acute intervention. Patient verbalized understanding. After Visit Summary given.   SUBJECTIVE:  Karina Rivera is a 37 y.o. female who reports a sore throat; x 24 hours; no cough. With subj fever. Tolerating PO intake with painful swallowing. No tx PTA.   OBJECTIVE:  Vitals:   03/24/23 1057  BP: (!) 133/95  Pulse: 70  Resp: 17  Temp: 98.3 F (36.8 C)  TempSrc: Oral  SpO2: 97%    General appearance: alert; no distress HEENT: throat with marked erythema and with exudative tonsillar hypertrophy; uvula is midline Neck: supple with FROM; small bilateral cervical LAD Lungs: speaks full sentences without difficulty; unlabored Abd: soft; non-tender Skin: reveals no rash; warm and dry Psychological: alert and cooperative; normal mood and affect  Allergies   Allergen Reactions   Bactrim [Sulfamethoxazole-Trimethoprim] Rash    Past Medical History:  Diagnosis Date   Cancer (HCC)    Hypertension    Social History   Socioeconomic History   Marital status: Married    Spouse name: Not on file   Number of children: Not on file   Years of education: Not on file   Highest education level: Not on file  Occupational History   Not on file  Tobacco Use   Smoking status: Former   Smokeless tobacco: Never  Substance and Sexual Activity   Alcohol use: No   Drug use: No   Sexual activity: Yes    Birth control/protection: Implant  Other Topics Concern   Not on file  Social History Narrative   Not on file   Social Determinants of Health   Financial Resource Strain: Not on file  Food Insecurity: Not on file  Transportation Needs: Not on file  Physical Activity: Not on file  Stress: Not on file  Social Connections: Not on file  Intimate Partner Violence: Not on file   Family History  Problem Relation Age of Onset   Diabetes Father    Asthma Daughter    Heart disease Maternal Dulce Sellar, MD 03/24/23 1308
